# Patient Record
Sex: Female | Born: 1945 | Race: White | Hispanic: No | Marital: Married | Smoking: Former smoker
Health system: Southern US, Community
[De-identification: ages and names within clinical notes are randomized; demographics above are authoritative.]

## PROBLEM LIST (undated history)

## (undated) DIAGNOSIS — M1990 Unspecified osteoarthritis, unspecified site: Secondary | ICD-10-CM

## (undated) DIAGNOSIS — G4730 Sleep apnea, unspecified: Secondary | ICD-10-CM

## (undated) DIAGNOSIS — G459 Transient cerebral ischemic attack, unspecified: Secondary | ICD-10-CM

## (undated) DIAGNOSIS — I10 Essential (primary) hypertension: Secondary | ICD-10-CM

## (undated) DIAGNOSIS — C50919 Malignant neoplasm of unspecified site of unspecified female breast: Secondary | ICD-10-CM

## (undated) DIAGNOSIS — Z1379 Encounter for other screening for genetic and chromosomal anomalies: Secondary | ICD-10-CM

## (undated) HISTORY — DX: Transient cerebral ischemic attack, unspecified: G45.9

## (undated) HISTORY — PX: COLONOSCOPY: SHX174

## (undated) HISTORY — DX: Encounter for other screening for genetic and chromosomal anomalies: Z13.79

## (undated) HISTORY — DX: Unspecified osteoarthritis, unspecified site: M19.90

## (undated) HISTORY — DX: Essential (primary) hypertension: I10

## (undated) HISTORY — PX: DIAGNOSTIC LAPAROSCOPY: SUR761

## (undated) HISTORY — PX: BREAST BIOPSY: SHX20

---
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
## 1966-12-29 HISTORY — PX: APPENDECTOMY: SHX54

## 1984-12-29 DIAGNOSIS — C50919 Malignant neoplasm of unspecified site of unspecified female breast: Secondary | ICD-10-CM

## 1984-12-29 HISTORY — DX: Malignant neoplasm of unspecified site of unspecified female breast: C50.919

## 1984-12-29 HISTORY — PX: MASTECTOMY: SHX3

## 1984-12-29 HISTORY — PX: AUGMENTATION MAMMAPLASTY: SUR837

## 1984-12-29 HISTORY — PX: BREAST SURGERY: SHX581

## 1988-03-29 HISTORY — PX: BREAST SURGERY: SHX581

## 1991-12-30 HISTORY — PX: OTHER SURGICAL HISTORY: SHX169

## 2001-12-29 HISTORY — PX: BREAST EXCISIONAL BIOPSY: SUR124

## 2003-06-16 HISTORY — PX: BREAST SURGERY: SHX581

## 2004-12-04 ENCOUNTER — Ambulatory Visit: Payer: Self-pay | Admitting: General Surgery

## 2004-12-25 ENCOUNTER — Ambulatory Visit: Payer: Self-pay | Admitting: General Surgery

## 2005-02-07 HISTORY — PX: BREAST SURGERY: SHX581

## 2005-07-28 ENCOUNTER — Ambulatory Visit: Payer: Self-pay | Admitting: General Surgery

## 2006-03-16 ENCOUNTER — Ambulatory Visit: Payer: Self-pay | Admitting: General Surgery

## 2006-12-29 DIAGNOSIS — I10 Essential (primary) hypertension: Secondary | ICD-10-CM

## 2006-12-29 DIAGNOSIS — G459 Transient cerebral ischemic attack, unspecified: Secondary | ICD-10-CM

## 2006-12-29 HISTORY — DX: Transient cerebral ischemic attack, unspecified: G45.9

## 2006-12-29 HISTORY — DX: Essential (primary) hypertension: I10

## 2007-03-10 ENCOUNTER — Ambulatory Visit: Payer: Self-pay | Admitting: General Surgery

## 2007-10-07 ENCOUNTER — Observation Stay: Payer: Self-pay | Admitting: Internal Medicine

## 2007-10-07 ENCOUNTER — Other Ambulatory Visit: Payer: Self-pay

## 2007-10-22 ENCOUNTER — Ambulatory Visit: Payer: Self-pay | Admitting: Internal Medicine

## 2008-03-03 ENCOUNTER — Ambulatory Visit: Payer: Self-pay | Admitting: General Surgery

## 2008-12-29 HISTORY — PX: OTHER SURGICAL HISTORY: SHX169

## 2008-12-29 HISTORY — PX: DEBRIDEMENT AND CLOSURE WOUND: SHX5614

## 2009-03-05 ENCOUNTER — Ambulatory Visit: Payer: Self-pay | Admitting: General Surgery

## 2009-03-29 ENCOUNTER — Ambulatory Visit: Payer: Self-pay | Admitting: General Surgery

## 2009-04-20 ENCOUNTER — Ambulatory Visit: Payer: Self-pay | Admitting: Unknown Physician Specialty

## 2009-04-24 ENCOUNTER — Ambulatory Visit: Payer: Self-pay | Admitting: Unknown Physician Specialty

## 2010-03-07 ENCOUNTER — Ambulatory Visit: Payer: Self-pay | Admitting: General Surgery

## 2011-03-10 ENCOUNTER — Ambulatory Visit: Payer: Self-pay | Admitting: General Surgery

## 2011-03-11 ENCOUNTER — Ambulatory Visit: Payer: Self-pay | Admitting: General Surgery

## 2011-03-17 ENCOUNTER — Ambulatory Visit: Payer: Self-pay | Admitting: General Surgery

## 2011-03-19 LAB — PATHOLOGY REPORT

## 2011-05-17 HISTORY — PX: BREAST SURGERY: SHX581

## 2011-08-19 ENCOUNTER — Ambulatory Visit: Payer: Self-pay | Admitting: General Surgery

## 2012-03-15 ENCOUNTER — Ambulatory Visit: Payer: Self-pay | Admitting: General Surgery

## 2012-08-24 ENCOUNTER — Ambulatory Visit: Payer: Self-pay | Admitting: General Surgery

## 2013-02-04 ENCOUNTER — Encounter: Payer: Self-pay | Admitting: *Deleted

## 2013-02-04 DIAGNOSIS — I10 Essential (primary) hypertension: Secondary | ICD-10-CM | POA: Insufficient documentation

## 2013-03-16 ENCOUNTER — Ambulatory Visit: Payer: Self-pay | Admitting: General Surgery

## 2013-03-17 ENCOUNTER — Encounter: Payer: Self-pay | Admitting: General Surgery

## 2013-03-23 ENCOUNTER — Ambulatory Visit (INDEPENDENT_AMBULATORY_CARE_PROVIDER_SITE_OTHER): Payer: Medicare Other | Admitting: General Surgery

## 2013-03-23 ENCOUNTER — Encounter: Payer: Self-pay | Admitting: General Surgery

## 2013-03-23 VITALS — BP 144/68 | HR 70 | Resp 14 | Ht <= 58 in | Wt 107.0 lb

## 2013-03-23 DIAGNOSIS — C50919 Malignant neoplasm of unspecified site of unspecified female breast: Secondary | ICD-10-CM

## 2013-03-23 DIAGNOSIS — C50912 Malignant neoplasm of unspecified site of left female breast: Secondary | ICD-10-CM

## 2013-03-23 DIAGNOSIS — Z853 Personal history of malignant neoplasm of breast: Secondary | ICD-10-CM

## 2013-03-23 NOTE — Progress Notes (Signed)
Patient ID: Kendra [name], female   DOB: May 12, 1946, 68 y.o.   MRN: [id number]  Chief Complaint  Patient presents with  . Breast Cancer Long Term Follow Up    HPI Kendra [name] is a 67 y.o. female here today for her follow up mammogram .Patient report no new breast problems .Previously treated left breast cancer treated by modified radical mastectomy in February of 1986. 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 This was an estrogen-negative tumor. She underwent a stereotactic biopsy of the right breast in 2004, 2006 and then again in 2012 for microcalcifications. HPI  Past Medical History  Diagnosis Date  . Hypertension 2008  . Arthritis   . TIA (transient ischemic attack) 2008  . Cancer 1986    Left Mastectomy ,ductal carcinoma.     Past Surgical History  Procedure Laterality Date  . Colonoscopy  2005     Dr. Lemar Livings  . Debridement and closure wound  2010  . Uterine biopsy   2010  . Diagnostic laparoscopy    . Tonsillectomy  1962  . Appendectomy  1968  . Breast surgery  1986    left mastectomy  . Disc repair   1993  . Basal cell carcimona removal   2010    left lower leg     Family History  Problem Relation Age of Onset  . Ovarian cancer Mother     17    Social History History  Substance Use Topics  . Smoking status: Former Smoker -- 0.50 packs/day for 2 years    Types: Cigarettes  . Smokeless tobacco: Never Used  . Alcohol Use: No    Allergies  Allergen Reactions  . Seasonal Ic (Cholestatin)     Current Outpatient Prescriptions  Medication Sig Dispense Refill  . aspirin EC 81 MG tablet Take 81 mg by mouth daily.      Marland Kitchen atorvastatin (LIPITOR) 40 MG tablet Take 40 mg by mouth daily.      . clonazePAM (KLONOPIN) 0.5 MG tablet Take 0.5 mg by mouth.      . fexofenadine (ALLEGRA) 180 MG tablet Take 180 mg by mouth as needed.      . Fluticasone Propionate (FLONASE NA) Place into the nose.      . zolpidem (AMBIEN) 10 MG  tablet Take 10 mg by mouth.       No current facility-administered medications for this visit.    Review of Systems Review of Systems  Constitutional: Negative.   Respiratory: Negative.   Cardiovascular: Negative.     Blood pressure 144/68, pulse 70, resp. rate 14, height 4\' 9"  (1.448 m), weight 107 lb (48.535 kg).  Physical Exam Physical Exam  Constitutional: She appears well-developed and well-nourished.  Neck: Normal range of motion. Neck supple.  Cardiovascular: Normal rate and normal heart sounds.   Pulmonary/Chest: Effort normal. Right breast exhibits no inverted nipple, no mass, no nipple discharge, no skin change and no tenderness.    Data Reviewed Right breast mammogram dated 03/16/2013 showed stable calcifications. BI-RADS-2. Previously placed marking clips are unchanged.  Assessment    No evidence of right breast cancer or left breast cancer recurrence.    Plan    Followup exam in one year will be planned with a right breast mammogram.       Kendra [name] 03/26/2013, 9:01 AM

## 2013-03-26 ENCOUNTER — Encounter: Payer: Self-pay | Admitting: General Surgery

## 2013-03-26 DIAGNOSIS — C50912 Malignant neoplasm of unspecified site of left female breast: Secondary | ICD-10-CM | POA: Insufficient documentation

## 2014-03-17 ENCOUNTER — Ambulatory Visit: Payer: Self-pay | Admitting: General Surgery

## 2014-03-19 ENCOUNTER — Encounter: Payer: Self-pay | Admitting: General Surgery

## 2014-03-29 DIAGNOSIS — Z1379 Encounter for other screening for genetic and chromosomal anomalies: Secondary | ICD-10-CM

## 2014-03-29 HISTORY — DX: Encounter for other screening for genetic and chromosomal anomalies: Z13.79

## 2014-03-30 ENCOUNTER — Encounter: Payer: Self-pay | Admitting: General Surgery

## 2014-03-30 ENCOUNTER — Ambulatory Visit (INDEPENDENT_AMBULATORY_CARE_PROVIDER_SITE_OTHER): Payer: Medicare Other | Admitting: General Surgery

## 2014-03-30 VITALS — BP 110/68 | HR 72 | Resp 12 | Ht <= 58 in | Wt 114.0 lb

## 2014-03-30 DIAGNOSIS — C50912 Malignant neoplasm of unspecified site of left female breast: Secondary | ICD-10-CM

## 2014-03-30 DIAGNOSIS — Z853 Personal history of malignant neoplasm of breast: Secondary | ICD-10-CM

## 2014-03-30 DIAGNOSIS — Z1211 Encounter for screening for malignant neoplasm of colon: Secondary | ICD-10-CM

## 2014-03-30 MED ORDER — POLYETHYLENE GLYCOL 3350 17 GM/SCOOP PO POWD: ORAL | Status: DC

## 2014-03-30 NOTE — Progress Notes (Signed)
Patient ID: Lexany Belknap, female   DOB: 05-16-1946, 67 y.o.   MRN: [id number]  Chief Complaint  Patient presents with  . Follow-up    mammogram    HPI Nylene Inlow is a 68 y.o. female who presents for a breast evaluation. The most recent right breast mammogram was done on 03/17/14.Patient does perform regular self breast checks and gets regular mammograms done.    HPI  Past Medical History  Diagnosis Date  . Hypertension 2008  . Arthritis   . TIA (transient ischemic attack) 2008  . Cancer 1986    T1c,N0 invasive ductal carcinoma     Past Surgical History  Procedure Laterality Date  . Colonoscopy  2005     Dr. Bary Castilla  . Debridement and closure wound  2010  . Uterine biopsy   2010  . Diagnostic laparoscopy    . Tonsillectomy  1962  . Appendectomy  1968  . Disc repair   1993  . Basal cell carcimona removal   2010    left lower leg   . Breast surgery  1986    Left modified radical mastectomy.  . Breast surgery Right April 1989    biopsy: Fibrocystic changes.  . Breast surgery Right June 16, 2003    stereotactic biopsy showing stromal fibrosis and microcalcifications.  . Breast surgery Right May 17, 2011    hyalinized fibroadenoma  . Breast surgery Right February 07, 2005    Sclerosed fibroadenoma    Family History  Problem Relation Age of Onset  . Ovarian cancer Mother     49    Social History History  Substance Use Topics  . Smoking status: Former Smoker -- 0.50 packs/day for 2 years    Types: Cigarettes  . Smokeless tobacco: Never Used  . Alcohol Use: No    Allergies  Allergen Reactions  . Seasonal Ic [Cholestatin]     Current Outpatient Prescriptions  Medication Sig Dispense Refill  . aspirin EC 81 MG tablet Take 81 mg by mouth daily.      Marland Kitchen atorvastatin (LIPITOR) 40 MG tablet Take 40 mg by mouth daily.      . clonazePAM (KLONOPIN) 0.5 MG tablet Take 0.5 mg by mouth.      . fexofenadine (ALLEGRA) 180 MG tablet Take 180 mg by mouth as needed.       . Fluticasone Propionate (FLONASE NA) Place into the nose.      . potassium chloride (MICRO-K) 10 MEQ CR capsule Take 1 capsule by mouth daily.      . propranolol (INDERAL) 40 MG tablet Take 1 tablet by mouth daily.      Marland Kitchen zolpidem (AMBIEN) 10 MG tablet Take 10 mg by mouth.      . polyethylene glycol powder (GLYCOLAX/MIRALAX) powder 255 grams one bottle for colonoscopy prep  255 g  0   No current facility-administered medications for this visit.    Review of Systems Review of Systems  Constitutional: Negative.   Respiratory: Negative.   Cardiovascular: Negative.     Blood pressure 110/68, pulse 72, resp. rate 12, height 4\' 9"  (1.448 m), weight 114 lb (51.71 kg).  Physical Exam Physical Exam  Constitutional: She is oriented to person, place, and time. She appears well-developed and well-nourished.  Eyes: Conjunctivae are normal.  Neck: Neck supple.  Cardiovascular: Normal rate, regular rhythm and normal heart sounds.   Pulmonary/Chest: Effort normal and breath sounds normal. Right breast exhibits no inverted nipple, no mass, no nipple discharge, no skin change  and no tenderness.  Reconstied left breast    Neurological: She is alert and oriented to person, place, and time.  Skin: Skin is warm and dry.    Data Reviewed Right breast mammogram dated March 17, 2014 was reviewed and compared a test dose. No interval change. BI-RAD-1.  Assessment    Benign breast exam.  No evidence of left breast cancer recurrence.  Candidate for screening colonoscopy.     Plan    Followup mammograms were obtained in one year. The roll for screening colonoscopy was discussed. She is familiar with the procedure. Risks including bleeding and perforation were reviewed.      Patient has been scheduled for a colonoscopy on 04-12-14 at [facility]. It is okay for patient to continue 81 mg aspirin.  Robert Bellow 04/01/2014, 8:56 AM

## 2014-03-30 NOTE — Patient Instructions (Addendum)
Colonoscopy A colonoscopy is an exam to look at the entire large intestine (colon). This exam can help find problems such as tumors, polyps, inflammation, and areas of bleeding. The exam takes about 1 hour.  LET [facility] CARE PROVIDER KNOW ABOUT:   Any allergies you have.  All medicines you are taking, including vitamins, herbs, eye drops, creams, and over-the-counter medicines.  Previous problems you or members of your family have had with the use of anesthetics.  Any blood disorders you have.  Previous surgeries you have had.  Medical conditions you have. RISKS AND COMPLICATIONS  Generally, this is a safe procedure. However, as with any procedure, complications can occur. Possible complications include:  Bleeding.  Tearing or rupture of the colon wall.  Reaction to medicines given during the exam.  Infection (rare). BEFORE THE PROCEDURE   Ask your health care provider about changing or stopping your regular medicines.  You may be prescribed an oral bowel prep. This involves drinking a large amount of medicated liquid, starting the day before your procedure. The liquid will cause you to have multiple loose stools until your stool is almost clear or light green. This cleans out your colon in preparation for the procedure.  Do not eat or drink anything else once you have started the bowel prep, unless your health care provider tells you it is safe to do so.  Arrange for someone to drive you home after the procedure. PROCEDURE   You will be given medicine to help you relax (sedative).  You will lie on your side with your knees bent.  A long, flexible tube with a light and camera on the end (colonoscope) will be inserted through the rectum and into the colon. The camera sends video back to a computer screen as it moves through the colon. The colonoscope also releases carbon dioxide gas to inflate the colon. This helps your health care provider see the area better.  During  the exam, your health care provider may take a small tissue sample (biopsy) to be examined under a microscope if any abnormalities are found.  The exam is finished when the entire colon has been viewed. AFTER THE PROCEDURE   Do not drive for 24 hours after the exam.  You may have a small amount of blood in your stool.  You may pass moderate amounts of gas and have mild abdominal cramping or bloating. This is caused by the gas used to inflate your colon during the exam.  Ask when your test results will be ready and how you will get your results. Make sure you get your test results. Document Released: 12/12/2000 Document Revised: 10/05/2013 Document Reviewed: 08/22/2013 [facility] Patient Information 2014 [place]. Patient to return in one year right breast diagnotic mammogram.  Patient has been scheduled for a colonoscopy on 04-12-14 at [facility]. It is okay for patient to continue 81 mg aspirin.

## 2014-04-01 ENCOUNTER — Encounter: Payer: Self-pay | Admitting: General Surgery

## 2014-04-01 ENCOUNTER — Other Ambulatory Visit: Payer: Self-pay | Admitting: General Surgery

## 2014-04-01 DIAGNOSIS — Z1211 Encounter for screening for malignant neoplasm of colon: Secondary | ICD-10-CM

## 2014-04-12 ENCOUNTER — Ambulatory Visit: Payer: Self-pay | Admitting: General Surgery

## 2014-04-12 DIAGNOSIS — Z1211 Encounter for screening for malignant neoplasm of colon: Secondary | ICD-10-CM

## 2014-04-13 ENCOUNTER — Telehealth: Payer: Self-pay | Admitting: *Deleted

## 2014-04-13 ENCOUNTER — Encounter: Payer: Self-pay | Admitting: General Surgery

## 2014-04-13 DIAGNOSIS — K625 Hemorrhage of anus and rectum: Secondary | ICD-10-CM

## 2014-04-13 NOTE — Telephone Encounter (Signed)
Notified patient as instructed. Discussed follow-up appointments, patient agrees  

## 2014-04-13 NOTE — Telephone Encounter (Signed)
Message copied by Carson Myrtle on Thu Apr 13, 2014  2:32 PM ------      Message from: [place], [place] W      Created: Thu Apr 13, 2014  2:05 PM       Please arrange for patient to have a MyRisk sample drawn, also, she needs a CBC for rectal bleeding.        HX: Breast cancer at age 68.       Next week is fine.  ------

## 2014-04-13 NOTE — Telephone Encounter (Signed)
appt for 04-19-14

## 2014-04-18 ENCOUNTER — Encounter: Payer: Self-pay | Admitting: *Deleted

## 2014-04-19 ENCOUNTER — Ambulatory Visit: Payer: Medicare Other | Admitting: *Deleted

## 2014-04-19 DIAGNOSIS — K625 Hemorrhage of anus and rectum: Secondary | ICD-10-CM

## 2014-04-19 DIAGNOSIS — C50912 Malignant neoplasm of unspecified site of left female breast: Secondary | ICD-10-CM

## 2014-04-19 NOTE — Progress Notes (Signed)
Genetic testing completed as well as Labs.

## 2014-04-20 LAB — CBC WITH DIFFERENTIAL
Basophils Absolute: 0.1 10*3/uL (ref 0.0–0.2)
Basos: 1 %
EOS ABS: 0.3 10*3/uL (ref 0.0–0.4)
Eos: 4 %
HCT: 41.2 % (ref 34.0–46.6)
Hemoglobin: 13.6 g/dL (ref 11.1–15.9)
IMMATURE GRANULOCYTES: 0 %
Immature Grans (Abs): 0 10*3/uL (ref 0.0–0.1)
LYMPHS ABS: 1.7 10*3/uL (ref 0.7–3.1)
Lymphs: 22 %
MCH: 28.7 pg (ref 26.6–33.0)
MCHC: 33 g/dL (ref 31.5–35.7)
MCV: 87 fL (ref 79–97)
MONOCYTES: 8 %
Monocytes Absolute: 0.6 10*3/uL (ref 0.1–0.9)
Neutrophils Absolute: 5 10*3/uL (ref 1.4–7.0)
Neutrophils Relative %: 65 %
Platelets: 221 10*3/uL (ref 150–379)
RBC: 4.74 x10E6/uL (ref 3.77–5.28)
RDW: 13.6 % (ref 12.3–15.4)
WBC: 7.7 10*3/uL (ref 3.4–10.8)

## 2014-05-04 ENCOUNTER — Encounter: Payer: Self-pay | Admitting: General Surgery

## 2014-05-09 ENCOUNTER — Telehealth: Payer: Self-pay | Admitting: General Surgery

## 2014-05-09 NOTE — Telephone Encounter (Signed)
MyRisk genetic results negative for deleterious mutation. Patient notified.

## 2014-06-01 ENCOUNTER — Encounter: Payer: Self-pay | Admitting: General Surgery

## 2014-10-30 ENCOUNTER — Encounter: Payer: Self-pay | Admitting: *Deleted

## 2015-03-20 ENCOUNTER — Encounter: Payer: Self-pay | Admitting: General Surgery

## 2015-03-20 ENCOUNTER — Ambulatory Visit: Payer: Self-pay | Admitting: General Surgery

## 2015-03-26 ENCOUNTER — Encounter: Payer: Self-pay | Admitting: General Surgery

## 2015-03-26 ENCOUNTER — Ambulatory Visit (INDEPENDENT_AMBULATORY_CARE_PROVIDER_SITE_OTHER): Payer: Medicare Other | Admitting: General Surgery

## 2015-03-26 VITALS — BP 116/68 | HR 68 | Resp 12 | Ht <= 58 in | Wt 119.0 lb

## 2015-03-26 DIAGNOSIS — C50912 Malignant neoplasm of unspecified site of left female breast: Secondary | ICD-10-CM

## 2015-03-26 NOTE — Progress Notes (Signed)
Patient ID: Kendra [name], female   DOB: 1946/01/27, 68 y.o.   MRN: [id number]  Chief Complaint  Patient presents with  . Follow-up    mammogram    HPI Kendra [name] is a 69 y.o. female. who presents for a breast evaluation. The most recent right breast mammogram was done on 03/20/15  .  Patient does perform regular self breast checks and gets regular mammograms done.    HPI  Past Medical History  Diagnosis Date  . Hypertension 2008  . Arthritis   . TIA (transient ischemic attack) 2008  . Cancer 1986    T1c,N0 invasive ductal carcinoma   . Genetic testing April 2015    No BRCA mutation.    Past Surgical History  Procedure Laterality Date  . Colonoscopy  2005,04/12/2014    Dr. Bary Castilla  . Debridement and closure wound  2010  . Uterine biopsy   2010  . Diagnostic laparoscopy    . Tonsillectomy  1962  . Appendectomy  1968  . Disc repair   1993  . Basal cell carcimona removal   2010    left lower leg   . Breast surgery  1986    Left modified radical mastectomy.  . Breast surgery Right April 1989    biopsy: Fibrocystic changes.  . Breast surgery Right June 16, 2003    stereotactic biopsy showing stromal fibrosis and microcalcifications.  . Breast surgery Right May 17, 2011    hyalinized fibroadenoma  . Breast surgery Right February 07, 2005    Sclerosed fibroadenoma    Family History  Problem Relation Age of Onset  . Ovarian cancer Mother 33    2010    Social History History  Substance Use Topics  . Smoking status: Former Smoker -- 0.50 packs/day for 2 years    Types: Cigarettes  . Smokeless tobacco: Never Used  . Alcohol Use: No    Allergies  Allergen Reactions  . Seasonal Ic [Cholestatin]     Current Outpatient Prescriptions  Medication Sig Dispense Refill  . aspirin EC 81 MG tablet Take 81 mg by mouth daily.    Marland Kitchen atorvastatin (LIPITOR) 40 MG tablet Take 40 mg by mouth daily.    . clonazePAM (KLONOPIN) 0.5 MG tablet Take 0.5 mg by mouth.    .  fexofenadine (ALLEGRA) 180 MG tablet Take 180 mg by mouth as needed.    . Fluticasone Propionate (FLONASE NA) Place into the nose.    . polyethylene glycol powder (GLYCOLAX/MIRALAX) powder 255 grams one bottle for colonoscopy prep 255 g 0  . potassium chloride (MICRO-K) 10 MEQ CR capsule Take 1 capsule by mouth daily.    . propranolol (INDERAL) 40 MG tablet Take 1 tablet by mouth daily.    Marland Kitchen zolpidem (AMBIEN) 10 MG tablet Take 10 mg by mouth.     No current facility-administered medications for this visit.    Review of Systems Review of Systems  Constitutional: Negative.   Respiratory: Negative.   Cardiovascular: Negative.     Blood pressure 116/68, pulse 68, resp. rate 12, height 4' 9"  (1.448 m), weight 119 lb (53.978 kg).  Physical Exam Physical Exam  Constitutional: She is oriented to person, place, and time. She appears well-developed and well-nourished.  Eyes: Conjunctivae are normal. No scleral icterus.  Neck: Neck supple.  Cardiovascular: Normal rate, regular rhythm and normal heart sounds.   Pulmonary/Chest: Effort normal and breath sounds normal. Right breast exhibits no inverted nipple, no mass, no nipple discharge, no  skin change and no tenderness.  Abdominal: Soft. Bowel sounds are normal. There is no tenderness.  Lymphadenopathy:    She has no cervical adenopathy.  Neurological: She is alert and oriented to person, place, and time.  Skin: Skin is warm and dry.  Reconstied left breast   Data Reviewed Screening mammogram of the right breastdated 03/20/2015 showed no interval change. BI-RADS  1.  Assessment    Benign breast exam.     Plan    The patient has been asked to return to the office in one year with a right screening mammogram.         PCP:  Sol Passer 03/26/2015, 9:48 PM

## 2015-03-26 NOTE — Patient Instructions (Signed)
The patient has been asked to return to the office in one year with a right screening mammogram.

## 2016-01-04 ENCOUNTER — Other Ambulatory Visit: Payer: Self-pay

## 2016-01-04 DIAGNOSIS — Z1231 Encounter for screening mammogram for malignant neoplasm of breast: Secondary | ICD-10-CM

## 2016-01-22 ENCOUNTER — Encounter: Payer: Self-pay | Admitting: Podiatry

## 2016-01-22 ENCOUNTER — Ambulatory Visit (INDEPENDENT_AMBULATORY_CARE_PROVIDER_SITE_OTHER): Payer: Medicare Other

## 2016-01-22 ENCOUNTER — Ambulatory Visit (INDEPENDENT_AMBULATORY_CARE_PROVIDER_SITE_OTHER): Payer: Medicare Other | Admitting: Podiatry

## 2016-01-22 DIAGNOSIS — M722 Plantar fascial fibromatosis: Secondary | ICD-10-CM

## 2016-01-22 DIAGNOSIS — R52 Pain, unspecified: Secondary | ICD-10-CM | POA: Diagnosis not present

## 2016-01-22 MED ORDER — DICLOFENAC SODIUM 75 MG PO TBEC: 75.0000 mg | DELAYED_RELEASE_TABLET | Freq: Two times a day (BID) | ORAL | Status: DC

## 2016-01-22 NOTE — Patient Instructions (Signed)

## 2016-01-22 NOTE — Progress Notes (Signed)
   Subjective:    Patient ID: Kendra [name], female    DOB: 12-29-1946, 70 y.o.   MRN: [id number]  HPI  70 year old female presents to the office today for concerns of left heel pain which has been ongoing for more than 6 months. 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 She also developed pain on the outside of the foot. No tingling or numbness. No swelling or redness. No injury or trauma. No pain at night. No claudication symptoms.   Has tried Dr. Zoe Lan inserts but not helping. She did buy "plantar fasciitis" inserts last week which helped some. She is taking ibuprofen which helps.     Review of Systems  All other systems reviewed and are negative.      Objective:   Physical Exam General: AAO x3, NAD  Dermatological: Skin is warm, dry and supple bilateral. [name] x 10 are well manicured; remaining integument appears unremarkable at this time. There are no open sores, no preulcerative lesions, no rash or signs of infection present.  Vascular: Dorsalis Pedis artery and Posterior Tibial artery pedal pulses are 2/4 bilateral with immedate capillary fill time. Pedal hair growth present. No varicosities and no lower extremity edema present bilateral. There is no pain with calf compression, swelling, warmth, erythema.   Neruologic: Grossly intact via light touch bilateral. Vibratory intact via tuning fork bilateral. Protective threshold with Semmes Wienstein monofilament intact to all pedal sites bilateral. Patellar and Achilles deep tendon reflexes 2+ bilateral. No Babinski or clonus noted bilateral.   Musculoskeletal: Tenderness to palpation along the plantar medial tubercle of the calcaneus at the insertion of plantar fascia on the left foot. There is no pain along the course of the plantar fascia within the arch of the foot. Plantar fascia appears to be intact. There is no pain with lateral compression of the calcaneus or pain with  vibratory sensation. There is no pain along the course or insertion of the achilles tendon. No other areas of tenderness to bilateral lower extremities. Muscular strength 5/5 in all groups tested bilateral.  Gait: Unassisted, Nonantalgic.       Assessment & Plan:  70 year old female likely plantar fasciitis left foot -Treatment options discussed including all alternatives, risks, and complications -Etiology of symptoms were discussed -X-rays were obtained and reviewed with the patient.  -Patient elects to proceed with steroid injection into the left heel. Under sterile skin preparation, a total of 2.5cc of kenalog 10, 0.5% Marcaine plain, and 2% lidocaine plain were infiltrated into the symptomatic area without complication. A band-aid was applied. Patient tolerated the injection well without complication. Post-injection care with discussed with the patient. Discussed with the patient to ice the area over the next couple of days to help prevent a steroid flare.  -Prescribed voltaren. Discussed side effects of the medication and directed to stop if any are to occur and call the office.  - Icing and stretching exercises daily. -Plantar fascial brace. -Shoegear changes -Follow-up in 3 weeks or sooner if any problems arise. In the meantime, encouraged to call the office with any questions, concerns, change in symptoms.   Celesta Gentile, DPM

## 2016-02-12 ENCOUNTER — Encounter: Payer: Self-pay | Admitting: Podiatry

## 2016-02-12 ENCOUNTER — Ambulatory Visit (INDEPENDENT_AMBULATORY_CARE_PROVIDER_SITE_OTHER): Payer: Medicare Other | Admitting: Podiatry

## 2016-02-12 DIAGNOSIS — M722 Plantar fascial fibromatosis: Secondary | ICD-10-CM | POA: Diagnosis not present

## 2016-02-12 NOTE — Progress Notes (Signed)
Patient ID: Kendra [name], female   DOB: 1946-03-15, 70 y.o.   MRN: [id number]   Subjective: Kendra [name] presents to the office today for follow-up evaluation of left heel pain. They state that they are doing much better but still having some pain to the bottom of the heel. They have been icing, stretching, try to wear supportive shoe as much as possible.  No other complaints at this time. No acute changes since last appointment. They deny any systemic complaints such as fevers, chills, nausea, vomiting.  Objective: General: AAO x3, NAD  Dermatological: Skin is warm, dry and supple bilateral. Nails x 10 are well manicured; remaining integument appears unremarkable at this time. There are no open sores, no preulcerative lesions, no rash or signs of infection present.  Vascular: Dorsalis Pedis artery and Posterior Tibial artery pedal pulses are 2/4 bilateral with immedate capillary fill time. Pedal hair growth present. There is no pain with calf compression, swelling, warmth, erythema.   Neruologic: Grossly intact via light touch bilateral. Vibratory intact via tuning fork bilateral. Protective threshold with Semmes Wienstein monofilament intact to all pedal sites bilateral.   Musculoskeletal: There is mild, but improved, tenderness palpation along the plantar medial tubercle of the calcaneus at the insertion of the plantar fascia on the left foot. There is no pain along the course of the plantar fascia within the arch of the foot. Plantar fascia appears to be intact bilaterally. There is no pain with lateral compression of the calcaneus and there is no pain with vibratory sensation. There is no pain along the course or insertion of the Achilles tendon. There are no other areas of tenderness to bilateral lower extremities. No gross boney pedal deformities bilateral. No pain, crepitus, or limitation noted with foot and ankle range of motion bilateral. Muscular strength 5/5 in all groups tested  bilateral.  Gait: Unassisted, Nonantalgic.   Assessment: Presents for follow-up evaluation for heel pain, likely plantar fasciitis   Plan: -Treatment options discussed including all alternatives, risks, and complications Patient elects to proceed with steroid injection into the left heel. Under sterile skin preparation, a total of 2.5cc of kenalog 10, 0.5% Marcaine plain, and 2% lidocaine plain were infiltrated into the symptomatic area without complication. A band-aid was applied. Patient tolerated the injection well without complication. Post-injection care with discussed with the patient. Discussed with the patient to ice the area over the next couple of days to help prevent a steroid flare.  -Ice and stretching exercises on a daily basis. -Plantar fascia brace -Continue supportive shoe gear. Discussed orthotics but will see how she does with new shoes over the next couple of weeks.  -Follow-up in 4 weeks or sooner if any problems arise. In the meantime, encouraged to call the office with any questions, concerns, change in symptoms.   Celesta Gentile, DPM

## 2016-03-11 ENCOUNTER — Ambulatory Visit: Payer: Medicare Other | Admitting: Podiatry

## 2016-03-13 ENCOUNTER — Encounter: Payer: Self-pay | Admitting: Podiatry

## 2016-03-13 ENCOUNTER — Ambulatory Visit (INDEPENDENT_AMBULATORY_CARE_PROVIDER_SITE_OTHER): Payer: Medicare Other | Admitting: Podiatry

## 2016-03-13 VITALS — BP 134/68 | HR 72 | Resp 18

## 2016-03-13 DIAGNOSIS — M722 Plantar fascial fibromatosis: Secondary | ICD-10-CM

## 2016-03-14 DIAGNOSIS — M722 Plantar fascial fibromatosis: Secondary | ICD-10-CM | POA: Insufficient documentation

## 2016-03-14 NOTE — Progress Notes (Signed)
Patient ID: Kendra [name], female   DOB: October 06, 1946, 70 y.o.   MRN: [id number]  Subjective: Kendra [name] presents to the office today for follow-up evaluation of left heel pain. She feels overall she is improving but she still gets pain in the bottom of her left heel walking and standing at work. She states when she is off of work she does get relief. She also states that the injections have been helping. No numbness or tingling. They have been icing, stretching, try to wear supportive shoe as much as possible.  No other complaints at this time. No acute changes since last appointment. They deny any systemic complaints such as fevers, chills, nausea, vomiting.  Objective: General: AAO x3, NAD  Dermatological: Skin is warm, dry and supple bilateral. Nails x 10 are well manicured; remaining integument appears unremarkable at this time. There are no open sores, no preulcerative lesions, no rash or signs of infection present.  Vascular: Dorsalis Pedis artery and Posterior Tibial artery pedal pulses are 2/4 bilateral with immedate capillary fill time. Pedal hair growth present. There is no pain with calf compression, swelling, warmth, erythema.   Neruologic: Grossly intact via light touch bilateral. Vibratory intact via tuning fork bilateral. Protective threshold with Semmes Wienstein monofilament intact to all pedal sites bilateral.   Musculoskeletal: There is improved, tenderness palpation along the plantar medial tubercle of the calcaneus at the insertion of the plantar fascia on the left foot. There is no pain along the course of the plantar fascia within the arch of the foot. Plantar fascia appears to be intact bilaterally. There is no pain with lateral compression of the calcaneus and there is no pain with vibratory sensation. There is no pain along the course or insertion of the Achilles tendon. There are no other areas of tenderness to bilateral lower extremities. No gross boney pedal  deformities bilateral. No pain, crepitus, or limitation noted with foot and ankle range of motion bilateral. Muscular strength 5/5 in all groups tested bilateral.  Gait: Unassisted, Nonantalgic.   Assessment: Presents for follow-up evaluation for heel pain, likely plantar fasciitis and biomechanical changes.   Plan: -Treatment options discussed including all alternatives, risks, and complications Patient elects to proceed with steroid injection into the left heel. Under sterile skin preparation, a total of 2.5cc of kenalog 10, 0.5% Marcaine plain, and 2% lidocaine plain were infiltrated into the symptomatic area without complication. A band-aid was applied. Patient tolerated the injection well without complication. Post-injection care with discussed with the patient. Discussed with the patient to ice the area over the next couple of days to help prevent a steroid flare.  -Discussed orthotics. She wishes to hold off on custom due to cost. She would like to pursue with power steps which are dispensed today. -Plantar fascia brace as needed. -Voltaren prn -Continue supportive shoe gear. Discussed orthotics but will see how she does with new shoes over the next couple of weeks.  -Follow-up in 4 weeks or sooner if any problems arise. In the meantime, encouraged to call the office with any questions, concerns, change in symptoms.   Celesta Gentile, DPM

## 2016-03-20 ENCOUNTER — Ambulatory Visit
Admission: RE | Admit: 2016-03-20 | Discharge: 2016-03-20 | Disposition: A | Discharge status: Home / Self Care | Payer: Medicare Other | Source: Ambulatory Visit | Attending: General Surgery | Admitting: General Surgery

## 2016-03-20 ENCOUNTER — Other Ambulatory Visit: Payer: Self-pay | Admitting: General Surgery

## 2016-03-20 DIAGNOSIS — Z1231 Encounter for screening mammogram for malignant neoplasm of breast: Secondary | ICD-10-CM

## 2016-03-26 ENCOUNTER — Encounter: Payer: Self-pay | Admitting: General Surgery

## 2016-03-26 ENCOUNTER — Ambulatory Visit (INDEPENDENT_AMBULATORY_CARE_PROVIDER_SITE_OTHER): Payer: Medicare Other | Admitting: General Surgery

## 2016-03-26 VITALS — BP 118/74 | HR 68 | Resp 12 | Ht <= 58 in | Wt 118.0 lb

## 2016-03-26 DIAGNOSIS — Z853 Personal history of malignant neoplasm of breast: Secondary | ICD-10-CM | POA: Diagnosis not present

## 2016-03-26 NOTE — Progress Notes (Signed)
Patient ID: Kendra [name], female   DOB: 12-Dec-1946, 69 y.o.   MRN: [id number]  Chief Complaint  Patient presents with  . Follow-up    mammogram    HPI Kendra [name] is a 70 y.o. female.  who presents for her follow up breast cancer and a breast evaluation. The most recent right breast mammogram was done on 03/20/16 .  Patient does perform regular self breast checks and gets regular mammograms done. No new breast issues.  HPI  Past Medical History  Diagnosis Date  . Hypertension 2008  . Arthritis   . TIA (transient ischemic attack) 2008  . Genetic testing April 2015    No BRCA mutation.  . Cancer [facility]) 1986    T1c,N0 invasive ductal carcinoma LEFT    Past Surgical History  Procedure Laterality Date  . Colonoscopy  2005,04/12/2014    Dr. Bary Castilla  . Debridement and closure wound  2010  . Uterine biopsy   2010  . Diagnostic laparoscopy    . Tonsillectomy  1962  . Appendectomy  1968  . Disc repair   1993  . Basal cell carcimona removal   2010    left lower leg   . Breast surgery  1986    Left modified radical mastectomy.  . Breast surgery Right April 1989    biopsy: Fibrocystic changes.  . Breast surgery Right June 16, 2003    stereotactic biopsy showing stromal fibrosis and microcalcifications.  . Breast surgery Right May 17, 2011    hyalinized fibroadenoma  . Breast surgery Right February 07, 2005    Sclerosed fibroadenoma  . Breast biopsy Right 2003 2006 2012    Benign  . Mastectomy Left 1986    Left Implant    Family History  Problem Relation Age of Onset  . Ovarian cancer Mother 80    2010  . Breast cancer Cousin     Social History Social History  Substance Use Topics  . Smoking status: Former Smoker -- 0.50 packs/day for 2 years    Types: Cigarettes  . Smokeless tobacco: Never Used  . Alcohol Use: No    Allergies  Allergen Reactions  . Seasonal Ic [Cholestatin]     Current Outpatient Prescriptions  Medication Sig Dispense  Refill  . aspirin EC 81 MG tablet Take 81 mg by mouth daily.    Marland Kitchen atorvastatin (LIPITOR) 40 MG tablet Take 40 mg by mouth daily.    . clonazePAM (KLONOPIN) 0.5 MG tablet Take 0.5 mg by mouth.    . fexofenadine (ALLEGRA) 180 MG tablet Take 180 mg by mouth as needed.    . Fluticasone Propionate (FLONASE NA) Place into the nose.    . potassium chloride (MICRO-K) 10 MEQ CR capsule Take 1 capsule by mouth daily.    . propranolol (INDERAL) 40 MG tablet Take 1 tablet by mouth daily.    Marland Kitchen zolpidem (AMBIEN) 10 MG tablet Take 10 mg by mouth.     No current facility-administered medications for this visit.    Review of Systems Review of Systems  Constitutional: Negative.   Respiratory: Negative.   Cardiovascular: Negative.     Blood pressure 118/74, pulse 68, resp. rate 12, height _0  (1.448 m), weight 118 lb (53.524 kg).  Physical Exam Physical Exam  Constitutional: She is oriented to person, place, and time. She appears well-developed and well-nourished.  HENT:  Mouth/Throat: Oropharynx is clear and moist.  Eyes: Conjunctivae are normal. No scleral icterus.  Neck: Neck supple.  Cardiovascular: Normal rate, regular rhythm and normal heart sounds.   Pulmonary/Chest: Effort normal and breath sounds normal. Right breast exhibits no inverted nipple, no mass, no nipple discharge, no skin change and no tenderness. Left breast exhibits no inverted nipple, no mass, no nipple discharge, no skin change and no tenderness.    Reconstructive left breast stable.  Lymphadenopathy:    She has no cervical adenopathy.    She has no axillary adenopathy.  Neurological: She is alert and oriented to person, place, and time.  Skin: Skin is warm and dry.  Psychiatric: Her behavior is normal.    Data Reviewed Right breast mammogram dated 03/20/2016 was reviewed. No interval change. BI-RADS-2.  Assessment    Benign breast exam.    Plan        The patient has been asked to return to the office in  one year with a right screening mammogram.     PCP:  Kirk Ruths This information has been scribed by Karie Fetch RN, BSN,BC.    Robert Bellow 03/26/2016, 6:24 PM

## 2016-03-26 NOTE — Patient Instructions (Addendum)
The patient is aware to call back for any questions or concerns. The patient has been asked to return to the office in one year with a right screening mammogram.

## 2016-04-10 ENCOUNTER — Ambulatory Visit (INDEPENDENT_AMBULATORY_CARE_PROVIDER_SITE_OTHER): Payer: Medicare Other | Admitting: Podiatry

## 2016-04-10 ENCOUNTER — Encounter: Payer: Self-pay | Admitting: Podiatry

## 2016-04-10 VITALS — BP 155/94 | HR 68 | Resp 18

## 2016-04-10 DIAGNOSIS — M722 Plantar fascial fibromatosis: Secondary | ICD-10-CM

## 2016-04-13 NOTE — Progress Notes (Signed)
Patient ID: Kendra [name], female   DOB: 1946/09/26, 70 y.o.   MRN: [id number]  Subjective: [name] presents to the office today for follow-up evaluation of left heel pain. She feels that her heel pain is significantly improved compared to what it was previously. She really only gets pain is when she does a lot of standing and walking while at work. She has possible over-the-counter inserts, power steps, which seems to help but given her high arches it does not contour exact.   No other complaints at this time. No acute changes since last appointment. They deny any systemic complaints such as fevers, chills, nausea, vomiting.  Objective: General: AAO x3, NAD  Dermatological: Skin is warm, dry and supple bilateral. Nails x 10 are well manicured; remaining integument appears unremarkable at this time. There are no open sores, no preulcerative lesions, no rash or signs of infection present.  Vascular: Dorsalis Pedis artery and Posterior Tibial artery pedal pulses are 2/4 bilateral with immedate capillary fill time. Pedal hair growth present. There is no pain with calf compression, swelling, warmth, erythema.   Neruologic: Grossly intact via light touch bilateral. Vibratory intact via tuning fork bilateral. Protective threshold with Semmes Wienstein monofilament intact to all pedal sites bilateral.   Musculoskeletal: There is greatly improved  tenderness palpation along the plantar medial tubercle of the calcaneus at the insertion of the plantar fascia on the left foot. There is no pain along the course of the plantar fascia within the arch of the foot. Plantar fascia appears to be intact bilaterally. There is no pain with lateral compression of the calcaneus and there is no pain with vibratory sensation. There is no pain along the course or insertion of the Achilles tendon. There are no other areas of tenderness to bilateral lower extremities. No gross boney pedal deformities bilateral. No  pain, crepitus, or limitation noted with foot and ankle range of motion bilateral. Muscular strength 5/5 in all groups tested bilateral.  Gait: Unassisted, Nonantalgic.   Assessment: Presents for follow-up evaluation for heel pain, likely plantar fasciitis and biomechanical changes.   Plan: -Treatment options discussed including all alternatives, risks, and complications -At this point she is I have 3 steroid injections we will hold off on this as she is ready had this number and her pain has greatly improved. -I discussed with her custom orthotics that due to cost she wishes to hold off. I did modify her over-the-counter inserts help increase the arch. -Plantar fascial taping was applied today. She states this helped significantly. -Continue stretching and icing. -Voltaren prn -Continue supportive shoe gear. Discussed orthotics but will see how she does with new shoes over the next couple of weeks.  -Follow-up in 4 weeks if symptoms continue or sooner if any problems arise. In the meantime, encouraged to call the office with any questions, concerns, change in symptoms.   Celesta Gentile, DPM

## 2017-01-23 ENCOUNTER — Other Ambulatory Visit: Payer: Self-pay

## 2017-01-23 DIAGNOSIS — Z1231 Encounter for screening mammogram for malignant neoplasm of breast: Secondary | ICD-10-CM

## 2017-03-23 ENCOUNTER — Ambulatory Visit
Admission: RE | Admit: 2017-03-23 | Discharge: 2017-03-23 | Disposition: A | Discharge status: Home / Self Care | Payer: Medicare Other | Source: Ambulatory Visit | Attending: General Surgery | Admitting: General Surgery

## 2017-03-23 DIAGNOSIS — Z1231 Encounter for screening mammogram for malignant neoplasm of breast: Secondary | ICD-10-CM | POA: Insufficient documentation

## 2017-03-23 HISTORY — DX: Malignant neoplasm of unspecified site of unspecified female breast: C50.919

## 2017-03-26 ENCOUNTER — Encounter (INDEPENDENT_AMBULATORY_CARE_PROVIDER_SITE_OTHER): Payer: Self-pay

## 2017-03-31 ENCOUNTER — Encounter: Payer: Self-pay | Admitting: General Surgery

## 2017-03-31 ENCOUNTER — Ambulatory Visit (INDEPENDENT_AMBULATORY_CARE_PROVIDER_SITE_OTHER): Payer: Medicare Other | Admitting: General Surgery

## 2017-03-31 VITALS — BP 144/76 | HR 70 | Resp 12 | Ht <= 58 in | Wt 115.0 lb

## 2017-03-31 DIAGNOSIS — Z853 Personal history of malignant neoplasm of breast: Secondary | ICD-10-CM

## 2017-03-31 NOTE — Progress Notes (Signed)
Patient ID: Kendra [name], female   DOB: [birth year], 71 y.o.   MRN: [id number]  Chief Complaint  Patient presents with  . Follow-up    mammogram    HPI Kendra [name] is a 71 y.o. female.  who presents for her follow up breast cancer and a breast evaluation. The most recent mammogram was done on 03-23-17.  Patient does perform regular self breast checks and gets regular mammograms done. No new breast issues.   She is having some left shoulder discomfort, worse with activity, numbness to elbow.   HPI  Past Medical History:  Diagnosis Date  . Arthritis   . Breast cancer ([place]) 1986   T1c,N0 invasive ductal carcinoma LEFT  . Genetic testing April 2015   No BRCA mutation.  . Hypertension 2008  . TIA (transient ischemic attack) 2008    Past Surgical History:  Procedure Laterality Date  . APPENDECTOMY  1968  . AUGMENTATION MAMMAPLASTY Left 1986   LEFT ONLY - MASTECTOMY RECONSTRUCTION  . basal cell carcimona removal   2010   left lower leg   . BREAST BIOPSY Right 2006 & 2012   Benign  . BREAST EXCISIONAL BIOPSY Right 2003   NEG  . BREAST SURGERY  1986   Left modified radical mastectomy.  Marland Kitchen BREAST SURGERY Right April 1989   biopsy: Fibrocystic changes.  Marland Kitchen BREAST SURGERY Right June 16, 2003   stereotactic biopsy showing stromal fibrosis and microcalcifications.  Marland Kitchen BREAST SURGERY Right May 17, 2011   hyalinized fibroadenoma  . BREAST SURGERY Right February 07, 2005   Sclerosed fibroadenoma  . COLONOSCOPY  2005,04/12/2014   Normal exam in 2015, 10 year follow-up recommended. Dr. Bary Castilla  . DEBRIDEMENT AND CLOSURE WOUND  2010  . DIAGNOSTIC LAPAROSCOPY    . Disc repair   1993  . MASTECTOMY Left 1986   BREAST CANCER  . TONSILLECTOMY  1962  . uterine biopsy   2010    Family History  Problem Relation Age of Onset  . Breast cancer Cousin   . Ovarian cancer Mother 64    2010    Social History Social History  Substance Use Topics  . Smoking status: Former Smoker     Packs/day: 0.50    Years: 2.00    Types: Cigarettes  . Smokeless tobacco: Never Used  . Alcohol use No    Allergies  Allergen Reactions  . Seasonal Ic [Cholestatin]     Current Outpatient Prescriptions  Medication Sig Dispense Refill  . Fexofenadine HCl (ALLERGY 24-HR PO) Take by mouth as needed.    Marland Kitchen aspirin EC 81 MG tablet Take 81 mg by mouth daily.    Marland Kitchen atorvastatin (LIPITOR) 40 MG tablet Take 40 mg by mouth daily.    . clonazePAM (KLONOPIN) 0.5 MG tablet Take 0.5 mg by mouth.    . Fluticasone Propionate (FLONASE NA) Place into the nose.    . potassium chloride (MICRO-K) 10 MEQ CR capsule Take 1 capsule by mouth daily.    . propranolol (INDERAL) 40 MG tablet Take 1 tablet by mouth daily.    Marland Kitchen zolpidem (AMBIEN) 10 MG tablet Take 10 mg by mouth.     No current facility-administered medications for this visit.     Review of Systems Review of Systems  Constitutional: Negative.   Respiratory: Negative.   Cardiovascular: Negative.     Blood pressure (!) 144/76, pulse 70, resp. rate 12, height 4' 9"  (1.448 m), weight 115 lb (52.2 kg).  Physical Exam Physical  Exam  Constitutional: She is oriented to person, place, and time. She appears well-developed and well-nourished.  HENT:  Mouth/Throat: Oropharynx is clear and moist.  Eyes: Conjunctivae are normal. No scleral icterus.  Neck: Neck supple.  Cardiovascular: Normal rate, regular rhythm and normal heart sounds.   Pulmonary/Chest: Effort normal and breath sounds normal. Right breast exhibits no inverted nipple, no mass, no nipple discharge, no skin change and no tenderness. Left breast exhibits no inverted nipple, no mass, no nipple discharge, no skin change and no tenderness.    Reconstructive left breast stable.  Lymphadenopathy:    She has no cervical adenopathy.    She has no axillary adenopathy.  Neurological: She is alert and oriented to person, place, and time. She has normal strength. She displays no atrophy.  She exhibits normal muscle tone.  Tender over left supraspinatus   Skin: Skin is warm and dry.  Psychiatric: Her behavior is normal.    Data Reviewed Screening mammogram dated 03/23/2017 of the right breast was reviewed. BI-RADS-1.  Assessment    No evidence of recurrent breast cancer.  Right neck/shoulder discomfort with occasional numbness involving the left upper arms circumferentially. Negative motor exam.    Plan         Follow up with Dr Ouida Sills regarding her shoulder pain.  Patient will be asked to return to the office in one year with a right screening mammogram.   This information has been scribed by Karie Fetch RN, BSN,BC.   Robert Bellow 03/31/2017, 8:31 PM

## 2017-03-31 NOTE — Patient Instructions (Addendum)
The patient is aware to call back for any questions or concerns. Patient will be asked to return to the office in one year with a Uni R screening mammogram

## 2018-01-27 ENCOUNTER — Other Ambulatory Visit: Payer: Self-pay

## 2018-01-27 DIAGNOSIS — Z1231 Encounter for screening mammogram for malignant neoplasm of breast: Secondary | ICD-10-CM

## 2018-02-01 ENCOUNTER — Encounter: Payer: Self-pay | Admitting: *Deleted

## 2018-03-24 ENCOUNTER — Ambulatory Visit
Admission: RE | Admit: 2018-03-24 | Discharge: 2018-03-24 | Disposition: A | Discharge status: Home / Self Care | Payer: Medicare Other | Source: Ambulatory Visit | Attending: General Surgery | Admitting: General Surgery

## 2018-03-24 DIAGNOSIS — Z1231 Encounter for screening mammogram for malignant neoplasm of breast: Secondary | ICD-10-CM | POA: Diagnosis present

## 2018-04-08 ENCOUNTER — Ambulatory Visit (INDEPENDENT_AMBULATORY_CARE_PROVIDER_SITE_OTHER): Payer: Medicare Other | Admitting: General Surgery

## 2018-04-08 ENCOUNTER — Encounter: Payer: Self-pay | Admitting: General Surgery

## 2018-04-08 VITALS — BP 118/68 | HR 68 | Resp 14 | Ht 59.0 in | Wt 119.0 lb

## 2018-04-08 DIAGNOSIS — Z853 Personal history of malignant neoplasm of breast: Secondary | ICD-10-CM | POA: Diagnosis not present

## 2018-04-08 NOTE — Progress Notes (Signed)
Patient ID: Kendra [name], female   DOB: 29-Aug-1946, 72 y.o.   MRN: [id number]  Chief Complaint  Patient presents with  . Follow-up    HPI Kendra [name] is a 72 y.o. female who presents for a breast evaluation. The most recent mammogram was done on 03/24/2018 .  Patient does perform regular self breast checks and gets regular mammograms done.  Patient states sometimes she get a rash on her back and ankles.  She has been evaluated by Kendra Ser, MD from dermatology in this regard.  HPI  Past Medical History:  Diagnosis Date  . Arthritis   . Breast cancer ([place]) 1986   T1c,N0 invasive ductal carcinoma LEFT  . Genetic testing April 2015   No BRCA mutation.  . Hypertension 2008  . TIA (transient ischemic attack) 2008    Past Surgical History:  Procedure Laterality Date  . APPENDECTOMY  1968  . AUGMENTATION MAMMAPLASTY Left 1986   LEFT ONLY - MASTECTOMY RECONSTRUCTION  . basal cell carcimona removal   2010   left lower leg   . BREAST BIOPSY Right 2006 & 2012   Benign  . BREAST EXCISIONAL BIOPSY Right 2003   NEG  . BREAST SURGERY  1986   Left modified radical mastectomy.  Marland Kitchen BREAST SURGERY Right April 1989   biopsy: Fibrocystic changes.  Marland Kitchen BREAST SURGERY Right June 16, 2003   stereotactic biopsy showing stromal fibrosis and microcalcifications.  Marland Kitchen BREAST SURGERY Right May 17, 2011   hyalinized fibroadenoma  . BREAST SURGERY Right February 07, 2005   Sclerosed fibroadenoma  . COLONOSCOPY  2005,04/12/2014   Normal exam in 2015, 10 year follow-up recommended. Dr. Bary Castilla  . DEBRIDEMENT AND CLOSURE WOUND  2010  . DIAGNOSTIC LAPAROSCOPY    . Disc repair   1993  . MASTECTOMY Left 1986   BREAST CANCER  . TONSILLECTOMY  1962  . uterine biopsy   2010    Family History  Problem Relation Age of Onset  . Breast cancer Cousin   . Ovarian cancer Mother 11       2010    Social History Social History   Tobacco Use  . Smoking status: Former Smoker    Packs/day:  0.50    Years: 2.00    Pack years: 1.00    Types: Cigarettes  . Smokeless tobacco: Never Used  Substance Use Topics  . Alcohol use: No  . Drug use: No    Allergies  Allergen Reactions  . Seasonal Ic [Cholestatin]     Current Outpatient Medications  Medication Sig Dispense Refill  . amLODipine (NORVASC) 5 MG tablet Take 5 mg by mouth daily.    Marland Kitchen aspirin EC 81 MG tablet Take 81 mg by mouth daily.    Marland Kitchen atorvastatin (LIPITOR) 40 MG tablet Take 40 mg by mouth daily.    . clonazePAM (KLONOPIN) 0.5 MG tablet Take 0.5 mg by mouth.    . Fexofenadine HCl (ALLERGY 24-HR PO) Take by mouth as needed.    . Fluticasone Propionate (FLONASE NA) Place into the nose.    . potassium chloride (MICRO-K) 10 MEQ CR capsule Take 1 capsule by mouth daily.    . propranolol (INDERAL) 40 MG tablet Take 1 tablet by mouth daily.    Marland Kitchen zolpidem (AMBIEN) 10 MG tablet Take 10 mg by mouth.     No current facility-administered medications for this visit.     Review of Systems Review of Systems  Constitutional: Negative.   Respiratory: Negative.  Cardiovascular: Negative.     Blood pressure 118/68, pulse 68, resp. rate 14, height 4' 11"  (1.499 m), weight 119 lb (54 kg).  Physical Exam Physical Exam  Constitutional: She is oriented to person, place, and time. She appears well-developed and well-nourished.  HENT:  Mouth/Throat: Oropharynx is clear and moist.  Eyes: Conjunctivae are normal. No scleral icterus.  Neck: Neck supple.  Cardiovascular: Normal rate, regular rhythm and normal heart sounds.  Pulmonary/Chest: Effort normal and breath sounds normal. Right breast exhibits no inverted nipple, no mass, no nipple discharge, no skin change and no tenderness.    Left mastectomy site well healed.   Lymphadenopathy:    She has no cervical adenopathy.    She has no axillary adenopathy.  Neurological: She is alert and oriented to person, place, and time.  Skin: Skin is warm and dry.  Psychiatric: Her  behavior is normal.    Data Reviewed Right breast screening mammogram dated March 24, 2018 reviewed.  No interval change.  BI-RADS-1.  Assessment    No evidence of cancer recurrence.    Plan  The patient has been asked to return to the office in one year with a right diagnostic mammogram. The patient is aware to call back for any questions or concerns.   HPI, Physical Exam, Assessment and Plan have been scribed under the direction and in the presence of Kendra Ard, MD.  Kendra [name], CMA  I have completed the exam and reviewed the above documentation for accuracy and completeness.  I agree with the above.  Haematologist has been used and any errors in dictation or transcription are unintentional.  Kendra [name], M.D., F.A.C.S.  Kendra [name] Kendra [name] 04/09/2018, 7:18 AM

## 2018-04-08 NOTE — Patient Instructions (Signed)
The patient has been asked to return to the office in one year with a right diagnostic mammogram. The patient is aware to call back for any questions or concerns.

## 2018-10-03 IMAGING — MG MM DIGITAL SCREENING UNILAT*R* W/ TOMO W/ CAD
6 series · 6 of 14 positions shown · non-contrast
Comparison: Previous exam(s).

CLINICAL DATA: Screening.

EXAM:
DIGITAL SCREENING UNILATERAL RIGHT MAMMOGRAM WITH CAD AND TOMO

[R CC synth-2D]
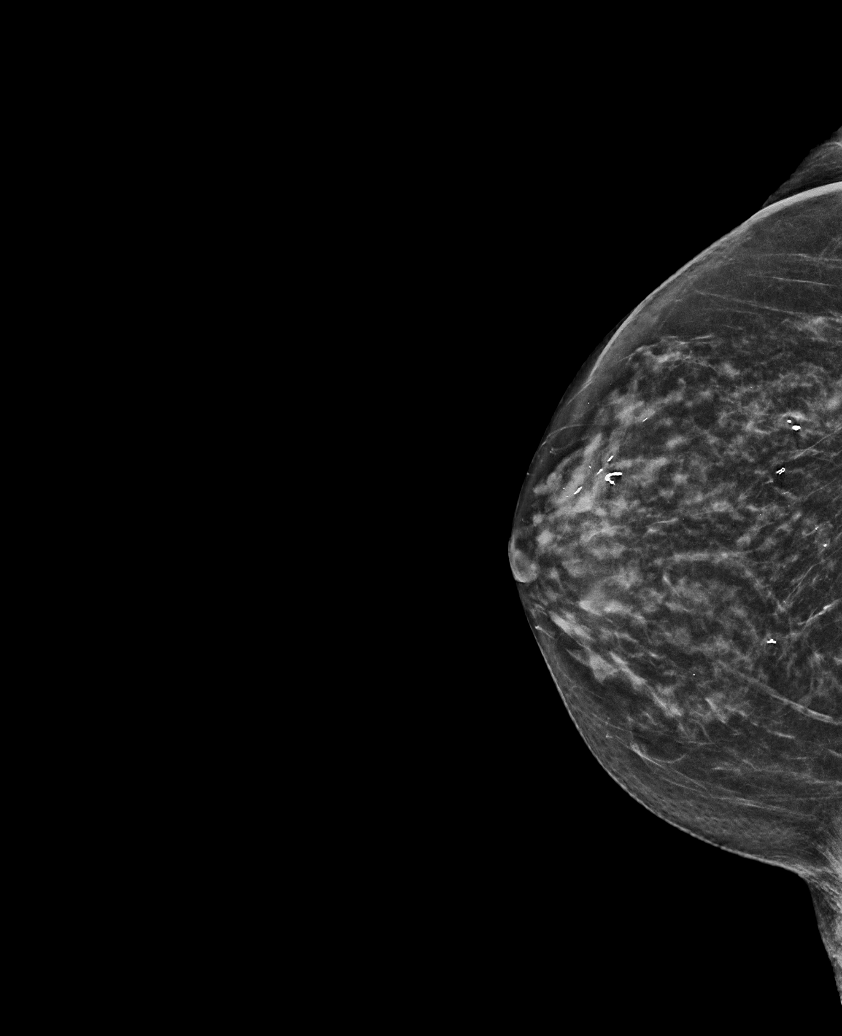

[R MLO]
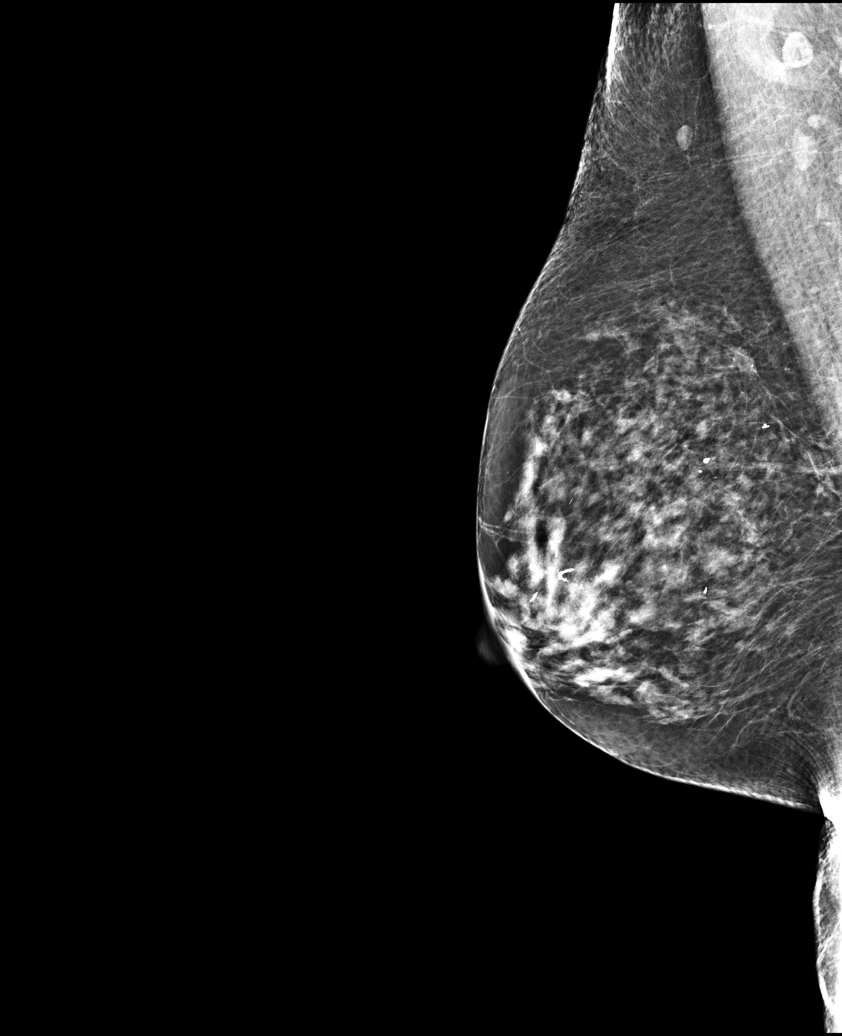

[R MLO synth-2D]
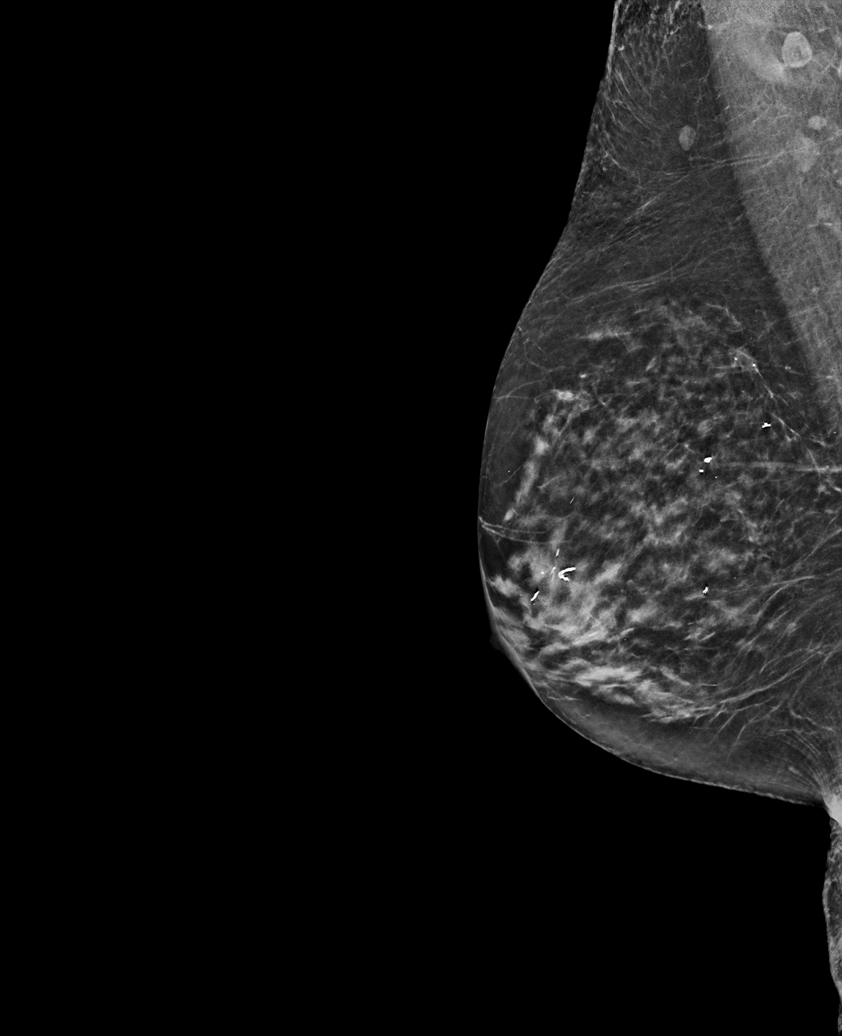

[R CC]
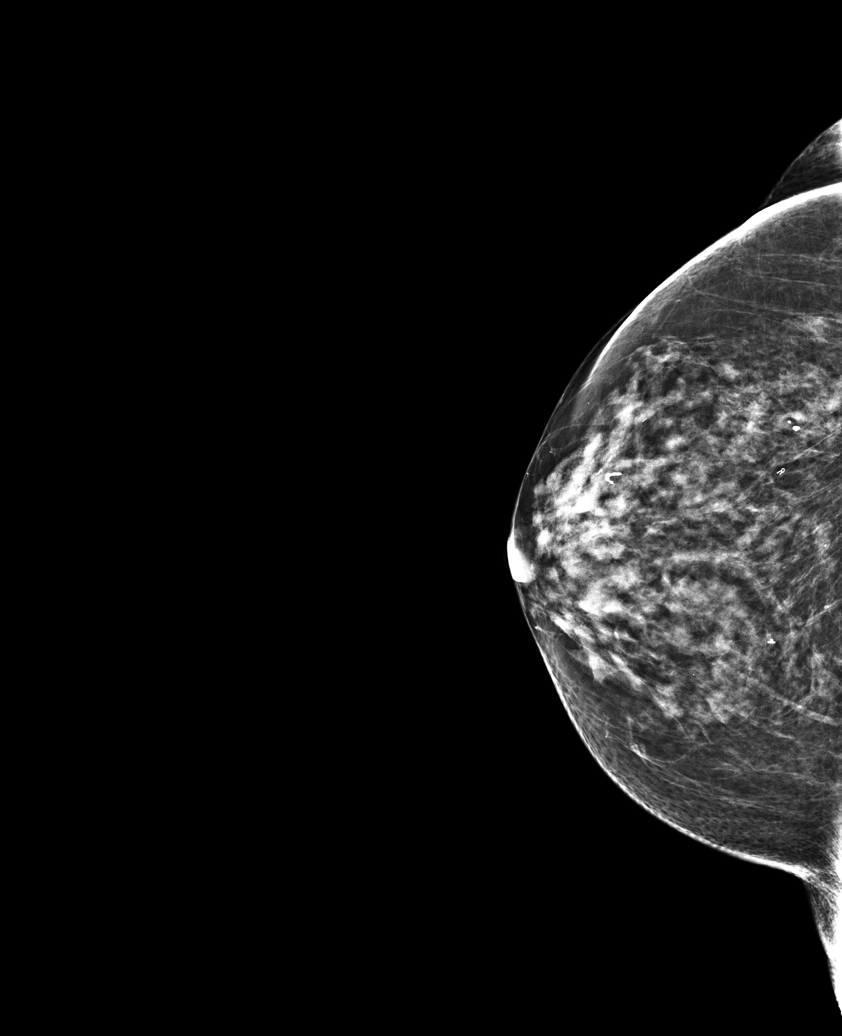

[R CC tomo · tomo slice 29/56.0]
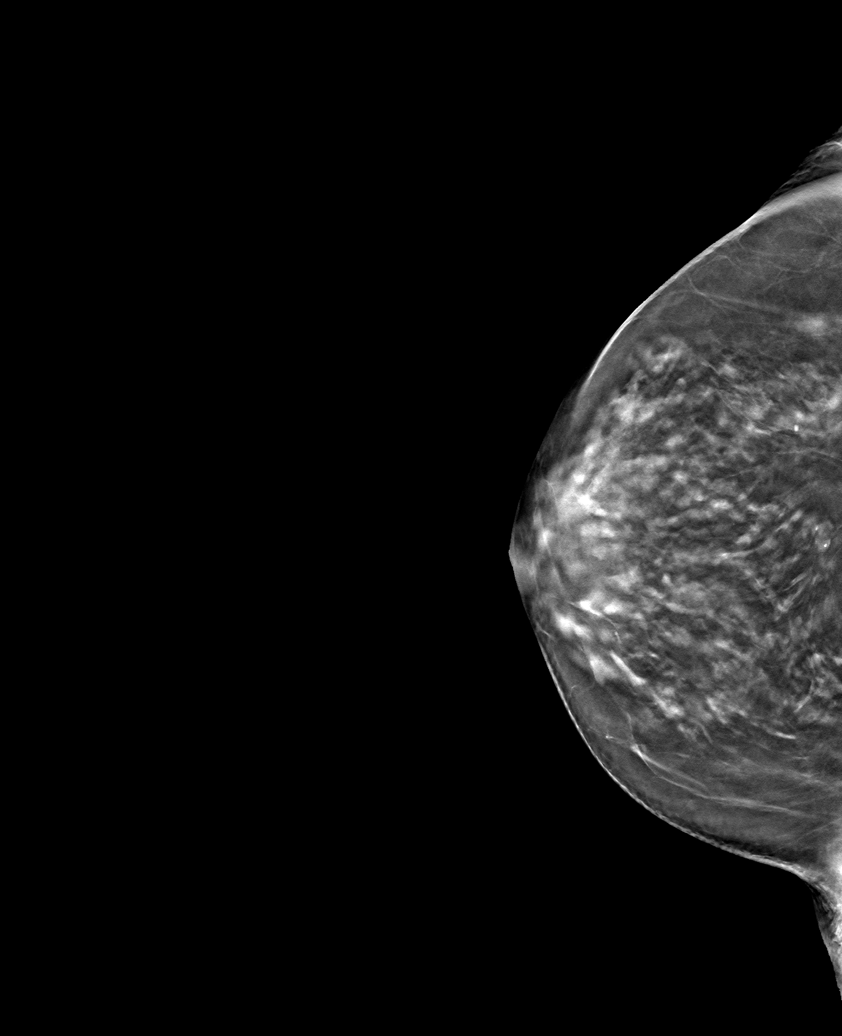

[R MLO tomo · tomo slice 30/59.0]
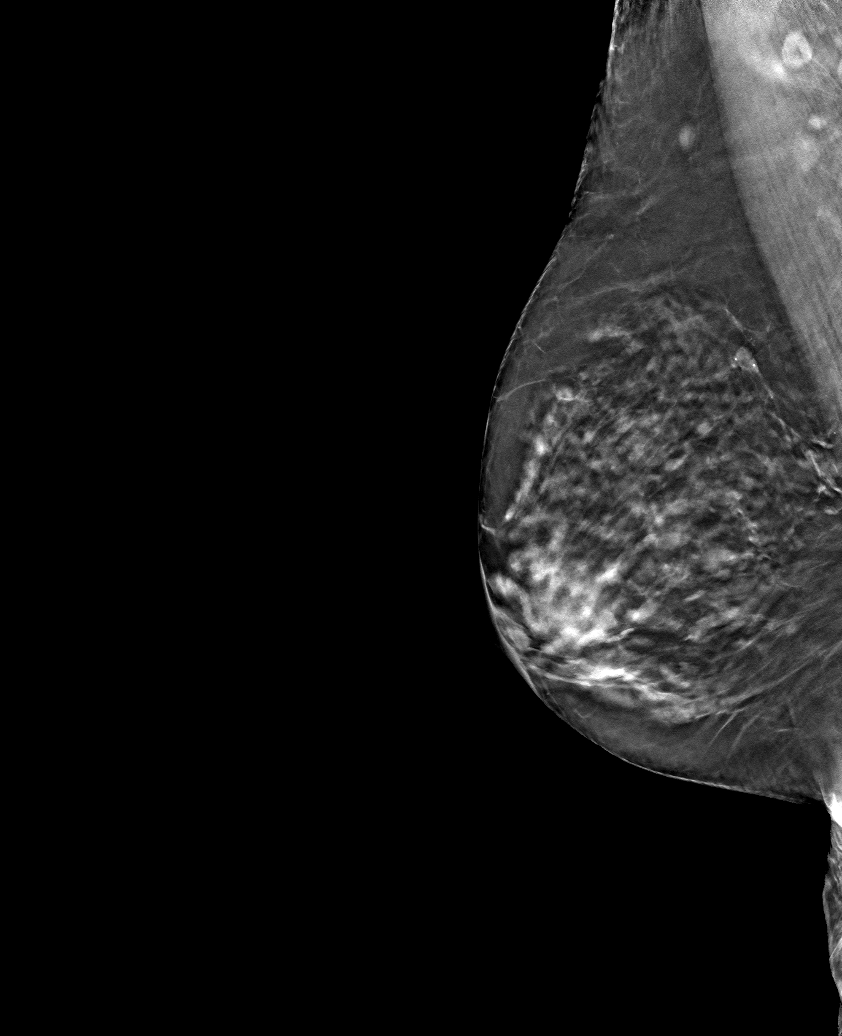

[6 of 14 positions shown; findings below may reference images not displayed]

ACR Breast Density Category c: The breast tissue is heterogeneously
dense, which may obscure small masses.
FINDINGS: The patient has had a left mastectomy. There are no findings
suspicious for malignancy. Images were processed with CAD.
IMPRESSION: No mammographic evidence of malignancy. A result letter of this
screening mammogram will be mailed directly to the patient.

RECOMMENDATION:
Screening mammogram in one year.  (Code:CY-P-D4Y)

BI-RADS CATEGORY  1: Negative.

## 2019-03-01 ENCOUNTER — Other Ambulatory Visit: Payer: Self-pay

## 2019-03-01 DIAGNOSIS — Z1231 Encounter for screening mammogram for malignant neoplasm of breast: Secondary | ICD-10-CM

## 2019-04-05 ENCOUNTER — Ambulatory Visit: Payer: Medicare Other | Admitting: General Surgery

## 2019-08-09 ENCOUNTER — Telehealth: Payer: Self-pay | Admitting: *Deleted

## 2019-08-09 NOTE — Telephone Encounter (Signed)
Called patient and left message about rescheduling her mammogram and then to come see either Dr.Pabon or Dr.Piscoya, she was a Dr.Byrnett patient.

## 2019-08-11 ENCOUNTER — Ambulatory Visit: Payer: Medicare Other | Admitting: General Surgery

## 2019-09-07 ENCOUNTER — Encounter: Payer: Self-pay | Admitting: *Deleted

## 2019-10-11 ENCOUNTER — Other Ambulatory Visit: Payer: Self-pay | Admitting: General Surgery

## 2019-10-11 DIAGNOSIS — Z1231 Encounter for screening mammogram for malignant neoplasm of breast: Secondary | ICD-10-CM

## 2020-06-28 ENCOUNTER — Other Ambulatory Visit: Payer: Self-pay | Admitting: General Surgery

## 2020-06-28 DIAGNOSIS — Z1231 Encounter for screening mammogram for malignant neoplasm of breast: Secondary | ICD-10-CM

## 2021-12-13 ENCOUNTER — Other Ambulatory Visit: Payer: Self-pay | Admitting: Internal Medicine

## 2021-12-13 DIAGNOSIS — Z853 Personal history of malignant neoplasm of breast: Secondary | ICD-10-CM

## 2021-12-13 DIAGNOSIS — Z1231 Encounter for screening mammogram for malignant neoplasm of breast: Secondary | ICD-10-CM

## 2022-10-01 ENCOUNTER — Encounter: Payer: Self-pay | Admitting: Ophthalmology

## 2022-10-06 NOTE — Discharge Instructions (Signed)

## 2022-10-08 ENCOUNTER — Other Ambulatory Visit: Payer: Self-pay

## 2022-10-08 ENCOUNTER — Ambulatory Visit: Payer: Medicare Other | Admitting: Anesthesiology

## 2022-10-08 ENCOUNTER — Ambulatory Visit
Admission: RE | Admit: 2022-10-08 | Discharge: 2022-10-08 | Disposition: A | Discharge status: Home / Self Care | Payer: Medicare Other | Attending: Ophthalmology | Admitting: Ophthalmology

## 2022-10-08 ENCOUNTER — Encounter: Payer: Self-pay | Admitting: Ophthalmology

## 2022-10-08 ENCOUNTER — Encounter
Admission: RE | Disposition: A | Discharge status: Home / Self Care | Payer: Self-pay | Source: Home / Self Care | Attending: Ophthalmology

## 2022-10-08 DIAGNOSIS — H2511 Age-related nuclear cataract, right eye: Secondary | ICD-10-CM | POA: Diagnosis not present

## 2022-10-08 DIAGNOSIS — Z8673 Personal history of transient ischemic attack (TIA), and cerebral infarction without residual deficits: Secondary | ICD-10-CM | POA: Diagnosis not present

## 2022-10-08 DIAGNOSIS — I10 Essential (primary) hypertension: Secondary | ICD-10-CM | POA: Insufficient documentation

## 2022-10-08 DIAGNOSIS — Z87891 Personal history of nicotine dependence: Secondary | ICD-10-CM | POA: Insufficient documentation

## 2022-10-08 DIAGNOSIS — G4730 Sleep apnea, unspecified: Secondary | ICD-10-CM | POA: Diagnosis not present

## 2022-10-08 DIAGNOSIS — Z853 Personal history of malignant neoplasm of breast: Secondary | ICD-10-CM | POA: Insufficient documentation

## 2022-10-08 HISTORY — PX: CATARACT EXTRACTION W/PHACO: SHX586

## 2022-10-08 HISTORY — DX: Sleep apnea, unspecified: G47.30

## 2022-10-08 SURGERY — PHACOEMULSIFICATION, CATARACT, WITH IOL INSERTION
Anesthesia: Monitor Anesthesia Care | Site: Eye | Laterality: Right

## 2022-10-08 MED ORDER — CEFUROXIME OPHTHALMIC INJECTION 1 MG/0.1 ML
INJECTION | OPHTHALMIC | Status: DC | PRN
  Administered 2022-10-08: 1 mg via OPHTHALMIC

## 2022-10-08 MED ORDER — TETRACAINE HCL 0.5 % OP SOLN
1.0000 [drp] | OPHTHALMIC | Status: DC | PRN
  Administered 2022-10-08 (×3): 1 [drp] via OPHTHALMIC

## 2022-10-08 MED ORDER — SIGHTPATH DOSE#1 BSS IO SOLN
INTRAOCULAR | Status: DC | PRN
  Administered 2022-10-08: 1 mL via INTRAMUSCULAR

## 2022-10-08 MED ORDER — SIGHTPATH DOSE#1 BSS IO SOLN
INTRAOCULAR | Status: DC | PRN
  Administered 2022-10-08: 15 mL

## 2022-10-08 MED ORDER — ARMC OPHTHALMIC DILATING DROPS
1.0000 | OPHTHALMIC | Status: DC | PRN
  Administered 2022-10-08 (×3): 1 via OPHTHALMIC

## 2022-10-08 MED ORDER — SIGHTPATH DOSE#1 NA HYALUR & NA CHOND-NA HYALUR IO KIT
PACK | INTRAOCULAR | Status: DC | PRN
  Administered 2022-10-08: 1 via OPHTHALMIC

## 2022-10-08 MED ORDER — MIDAZOLAM HCL 2 MG/2ML IJ SOLN
INTRAMUSCULAR | Status: DC | PRN
  Administered 2022-10-08: 2 mg via INTRAVENOUS

## 2022-10-08 MED ORDER — SIGHTPATH DOSE#1 BSS IO SOLN
INTRAOCULAR | Status: DC | PRN
  Administered 2022-10-08: 76 mL via OPHTHALMIC

## 2022-10-08 MED ORDER — BRIMONIDINE TARTRATE-TIMOLOL 0.2-0.5 % OP SOLN
OPHTHALMIC | Status: DC | PRN
  Administered 2022-10-08: 1 [drp] via OPHTHALMIC

## 2022-10-08 MED ORDER — FENTANYL CITRATE (PF) 100 MCG/2ML IJ SOLN
INTRAMUSCULAR | Status: DC | PRN
  Administered 2022-10-08: 50 ug via INTRAVENOUS

## 2022-10-08 SURGICAL SUPPLY — 10 items
CATARACT SUITE SIGHTPATH (MISCELLANEOUS) ×1 IMPLANT
FEE CATARACT SUITE SIGHTPATH (MISCELLANEOUS) ×1 IMPLANT
GLOVE SRG 8 PF TXTR STRL LF DI (GLOVE) ×1 IMPLANT
GLOVE SURG ENC TEXT LTX SZ7.5 (GLOVE) ×1 IMPLANT
GLOVE SURG UNDER POLY LF SZ8 (GLOVE) ×1
LENS IOL TECNIS EYHANCE 22.0 (Intraocular Lens) IMPLANT
NDL FILTER BLUNT 18X1 1/2 (NEEDLE) ×1 IMPLANT
NEEDLE FILTER BLUNT 18X1 1/2 (NEEDLE) ×1 IMPLANT
SYR 3ML LL SCALE MARK (SYRINGE) ×1 IMPLANT
WATER STERILE IRR 250ML POUR (IV SOLUTION) ×1 IMPLANT

## 2022-10-08 NOTE — Transfer of Care (Signed)
Immediate Anesthesia Transfer of Care Note  Patient: Kendra [name]  Procedure(s) Performed: CATARACT EXTRACTION PHACO AND INTRAOCULAR LENS PLACEMENT (IOC) RIGHT (Right: Eye)  Patient Location: PACU  Anesthesia Type: MAC  Level of Consciousness: awake, alert  and patient cooperative  Airway and Oxygen Therapy: Patient Spontanous Breathing and Patient connected to supplemental oxygen  Post-op Assessment: Post-op Vital signs reviewed, Patient's Cardiovascular Status Stable, Respiratory Function Stable, Patent Airway and No signs of Nausea or vomiting  Post-op Vital Signs: Reviewed and stable  Complications: There were no known notable events for this encounter.

## 2022-10-08 NOTE — Anesthesia Postprocedure Evaluation (Signed)
Anesthesia Post Note  Patient: Kendra [name]  Procedure(s) Performed: CATARACT EXTRACTION PHACO AND INTRAOCULAR LENS PLACEMENT (IOC) RIGHT (Right: Eye)  Patient location during evaluation: PACU Anesthesia Type: MAC Level of consciousness: awake and alert Pain management: pain level controlled Vital Signs Assessment: post-procedure vital signs reviewed and stable Respiratory status: spontaneous breathing, nonlabored ventilation and respiratory function stable Cardiovascular status: blood pressure returned to baseline and stable Postop Assessment: no apparent nausea or vomiting Anesthetic complications: no   There were no known notable events for this encounter.   Last Vitals:  Vitals:   10/08/22 1356 10/08/22 1400  BP: 132/80 124/69  Pulse: (!) 51 (!) 51  Resp: 14 (!) 9  Temp: (!) 36.4 C   SpO2: 98% 97%    Last Pain:  Vitals:   10/08/22 1356  PainSc: 0-No pain                 [place] Ouch

## 2022-10-08 NOTE — Anesthesia Preprocedure Evaluation (Addendum)
Anesthesia Evaluation  Patient identified by MRN, date of birth, ID band Patient awake    Reviewed: Allergy & Precautions, NPO status , Patient's Chart, lab work & pertinent test results  Airway Mallampati: II  TM Distance: >3 FB Neck ROM: full    Dental no notable dental hx.    Pulmonary sleep apnea , former smoker,    Pulmonary exam normal        Cardiovascular hypertension, Normal cardiovascular exam     Neuro/Psych TIAnegative psych ROS   GI/Hepatic negative GI ROS, Neg liver ROS,   Endo/Other  negative endocrine ROS  Renal/GU      Musculoskeletal  (+) Arthritis ,   Abdominal   Peds  Hematology negative hematology ROS (+)   Anesthesia Other Findings Past Medical History: No date: Arthritis 1986: Breast cancer (HCC)     Comment:  T1c,N0 invasive ductal carcinoma LEFT 03/2014: Genetic testing     Comment:  No BRCA mutation. 2008: Hypertension No date: Sleep apnea     Comment:  CPAP 2008: TIA (transient ischemic attack)  Past Surgical History: 1968: APPENDECTOMY 1986: AUGMENTATION MAMMAPLASTY; Left     Comment:  LEFT ONLY - MASTECTOMY RECONSTRUCTION 2010: basal cell carcimona removal      Comment:  left lower leg  2006 & 2012: BREAST BIOPSY; Right     Comment:  Benign 2003: BREAST EXCISIONAL BIOPSY; Right     Comment:  NEG 1986: BREAST SURGERY     Comment:  Left modified radical mastectomy. April 1989: BREAST SURGERY; Right     Comment:  biopsy: Fibrocystic changes. June 16, 2003: BREAST SURGERY; Right     Comment:  stereotactic biopsy showing stromal fibrosis and               microcalcifications. May 17, 2011: BREAST SURGERY; Right     Comment:  hyalinized fibroadenoma February 07, 2005: BREAST SURGERY; Right     Comment:  Sclerosed fibroadenoma 2005,04/12/2014: COLONOSCOPY     Comment:  Normal exam in 2015, 10 year follow-up recommended. Dr.               Byrnett 2010: DEBRIDEMENT AND  CLOSURE WOUND No date: DIAGNOSTIC LAPAROSCOPY 1993: Disc repair  1986: MASTECTOMY; Left     Comment:  BREAST CANCER 1962: TONSILLECTOMY 2010: uterine biopsy   BMI    Body Mass Index: 24.67 kg/m      Reproductive/Obstetrics negative OB ROS                             Anesthesia Physical  Anesthesia Plan  ASA: 3  Anesthesia Plan: MAC   Post-op Pain Management:    Induction:   PONV Risk Score and Plan:   Airway Management Planned: Natural Airway  Additional Equipment:   Intra-op Plan:   Post-operative Plan:   Informed Consent: I have reviewed the patients History and Physical, chart, labs and discussed the procedure including the risks, benefits and alternatives for the proposed anesthesia with the patient or authorized representative who has indicated his/her understanding and acceptance.       Plan Discussed with: Anesthesiologist, CRNA and Surgeon  Anesthesia Plan Comments:         Anesthesia Quick Evaluation  

## 2022-10-08 NOTE — H&P (Signed)
[facility]   Primary Care Physician:  Kirk Ruths, MD Ophthalmologist: Dr. Leandrew Koyanagi  Pre-Procedure History & Physical: HPI:  Kendra [name] is a 76 y.o. female here for ophthalmic surgery.   Past Medical History:  Diagnosis Date   Arthritis    Breast cancer ([place]) 1986   T1c,N0 invasive ductal carcinoma LEFT   Genetic testing 03/2014   No BRCA mutation.   Hypertension 2008   Sleep apnea    CPAP   TIA (transient ischemic attack) 2008    Past Surgical History:  Procedure Laterality Date   APPENDECTOMY  1968   AUGMENTATION MAMMAPLASTY Left 1986   LEFT ONLY - MASTECTOMY RECONSTRUCTION   basal cell carcimona removal   2010   left lower leg    BREAST BIOPSY Right 2006 & 2012   Benign   BREAST EXCISIONAL BIOPSY Right 2003   NEG   BREAST SURGERY  1986   Left modified radical mastectomy.   BREAST SURGERY Right April 1989   biopsy: Fibrocystic changes.   BREAST SURGERY Right June 16, 2003   stereotactic biopsy showing stromal fibrosis and microcalcifications.   BREAST SURGERY Right May 17, 2011   hyalinized fibroadenoma   BREAST SURGERY Right February 07, 2005   Sclerosed fibroadenoma   COLONOSCOPY  2005,04/12/2014   Normal exam in 2015, 10 year follow-up recommended. Dr. Bary Castilla   DEBRIDEMENT AND CLOSURE WOUND  2010   DIAGNOSTIC LAPAROSCOPY     Disc repair   1993   MASTECTOMY Left 1986   BREAST CANCER   TONSILLECTOMY  1962   uterine biopsy   2010    Prior to Admission medications   Medication Sig Start Date End Date Taking? Authorizing Provider  amLODipine (NORVASC) 5 MG tablet Take 5 mg by mouth daily.   Yes [provider]  aspirin EC 81 MG tablet Take 81 mg by mouth daily.   Yes [provider]  atorvastatin (LIPITOR) 40 MG tablet Take 10 mg by mouth daily.   Yes [provider]  clonazePAM (KLONOPIN) 0.5 MG tablet Take 0.5 mg by mouth.   Yes [provider]  potassium chloride (MICRO-K) 10 MEQ  CR capsule Take 1 capsule by mouth daily. 03/21/14  Yes [provider]  propranolol (INDERAL) 40 MG tablet Take 1.5 tablets by mouth 2 (two) times daily. 03/21/14  Yes [provider]    Allergies as of 08/27/2022 - Review Complete 03/31/2017  Allergen Reaction Noted   Seasonal ic [cholestatin]  02/04/2013    Family History  Problem Relation Age of Onset   Breast cancer Cousin    Ovarian cancer Mother 42       2010    Social History   Socioeconomic History   Marital status: Married    Spouse name: Not on file   Number of children: Not on file   Years of education: Not on file   Highest education level: Not on file  Occupational History   Not on file  Tobacco Use   Smoking status: Former    Packs/day: 0.50    Years: 2.00    Total pack years: 1.00    Types: Cigarettes    Quit date: 51    Years since quitting: 38.8   Smokeless tobacco: Never  Vaping Use   Vaping Use: Never used  Substance and Sexual Activity   Alcohol use: No   Drug use: No   Sexual activity: Not on file  Other Topics Concern  Not on file  Social History Narrative   Not on file   Social Determinants of Health   Financial Resource Strain: Not on file  Food Insecurity: Not on file  Transportation Needs: Not on file  Physical Activity: Not on file  Stress: Not on file  Social Connections: Not on file  Intimate Partner Violence: Not on file    Review of Systems: See HPI, otherwise negative ROS  Physical Exam: BP 136/86   Pulse (!) 59   Temp (!) 97 F (36.1 C)   Ht _0  (1.448 m)   Wt 49 kg   SpO2 96%   BMI 23.37 kg/m  General:   Alert,  pleasant and cooperative in NAD Head:  Normocephalic and atraumatic. Lungs:  Clear to auscultation.    Heart:  Regular rate and rhythm.   Impression/Plan: [name] is here for ophthalmic surgery.  Risks, benefits, limitations, and alternatives regarding ophthalmic surgery have been reviewed with the patient.   Questions have been answered.  All parties agreeable.   Leandrew Koyanagi, MD  10/08/2022, 12:51 PM

## 2022-10-08 NOTE — Op Note (Signed)
LOCATION:  [place]   PREOPERATIVE DIAGNOSIS:    Nuclear sclerotic cataract right eye. H25.11   POSTOPERATIVE DIAGNOSIS:  Nuclear sclerotic cataract right eye.     PROCEDURE:  Phacoemusification with posterior chamber intraocular lens placement of the right eye   ULTRASOUND TIME: Procedure(s) with comments: CATARACT EXTRACTION PHACO AND INTRAOCULAR LENS PLACEMENT (IOC) RIGHT (Right) - Sleep apnea 5.56 00:59.9  LENS:   Implant Name Type Inv. Item Serial No. Manufacturer Lot No. LRB No. Used Action  LENS IOL TECNIS EYHANCE 22.0 - [id number] Intraocular Lens LENS IOL TECNIS EYHANCE 22.0 [id number] [employer]  Right 1 Implanted         SURGEON:  Wyonia Hough, MD   ANESTHESIA:  Topical with tetracaine drops and 2% Xylocaine jelly, augmented with 1% preservative-free intracameral lidocaine.    COMPLICATIONS:  None.   DESCRIPTION OF PROCEDURE:  The patient was identified in the holding room and transported to the operating room and placed in the supine position under the operating microscope.  The right eye was identified as the operative eye and it was prepped and draped in the usual sterile ophthalmic fashion.   A 1 millimeter clear-corneal paracentesis was made at the 12:00 position.  0.5 ml of preservative-free 1% lidocaine was injected into the anterior chamber. The anterior chamber was filled with Viscoat viscoelastic.  A 2.4 millimeter keratome was used to make a near-clear corneal incision at the 9:00 position.  A curvilinear capsulorrhexis was made with a cystotome and capsulorrhexis forceps.  Balanced salt solution was used to hydrodissect and hydrodelineate the nucleus.   Phacoemulsification was then used in stop and chop fashion to remove the lens nucleus and epinucleus.  The remaining cortex was then removed using the irrigation and aspiration handpiece. Provisc was then placed into the capsular bag to distend it for lens placement.  A lens was then  injected into the capsular bag.  The remaining viscoelastic was aspirated.   Wounds were hydrated with balanced salt solution.  The anterior chamber was inflated to a physiologic pressure with balanced salt solution.  No wound leaks were noted. Cefuroxime 0.1 ml of a '10mg'$ /ml solution was injected into the anterior chamber for a dose of 1 mg of intracameral antibiotic at the completion of the case.   Timolol and Brimonidine drops were applied to the eye.  The patient was taken to the recovery room in stable condition without complications of anesthesia or surgery.   Kendra [name] 10/08/2022, 1:54 PM

## 2022-10-09 ENCOUNTER — Encounter: Payer: Self-pay | Admitting: Ophthalmology

## 2022-10-21 NOTE — Discharge Instructions (Signed)

## 2022-10-22 ENCOUNTER — Ambulatory Visit
Admission: RE | Admit: 2022-10-22 | Discharge: 2022-10-22 | Disposition: A | Discharge status: Home / Self Care | Payer: Medicare Other | Attending: Ophthalmology | Admitting: Ophthalmology

## 2022-10-22 ENCOUNTER — Encounter
Admission: RE | Disposition: A | Discharge status: Home / Self Care | Payer: Self-pay | Source: Home / Self Care | Attending: Ophthalmology

## 2022-10-22 ENCOUNTER — Other Ambulatory Visit: Payer: Self-pay

## 2022-10-22 ENCOUNTER — Ambulatory Visit: Payer: Medicare Other | Admitting: Anesthesiology

## 2022-10-22 ENCOUNTER — Encounter: Payer: Self-pay | Admitting: Ophthalmology

## 2022-10-22 DIAGNOSIS — Z87891 Personal history of nicotine dependence: Secondary | ICD-10-CM | POA: Diagnosis not present

## 2022-10-22 DIAGNOSIS — I10 Essential (primary) hypertension: Secondary | ICD-10-CM | POA: Insufficient documentation

## 2022-10-22 DIAGNOSIS — H2512 Age-related nuclear cataract, left eye: Secondary | ICD-10-CM | POA: Diagnosis present

## 2022-10-22 DIAGNOSIS — Z853 Personal history of malignant neoplasm of breast: Secondary | ICD-10-CM | POA: Diagnosis not present

## 2022-10-22 DIAGNOSIS — Z8673 Personal history of transient ischemic attack (TIA), and cerebral infarction without residual deficits: Secondary | ICD-10-CM | POA: Diagnosis not present

## 2022-10-22 DIAGNOSIS — G4730 Sleep apnea, unspecified: Secondary | ICD-10-CM | POA: Diagnosis not present

## 2022-10-22 HISTORY — PX: CATARACT EXTRACTION W/PHACO: SHX586

## 2022-10-22 SURGERY — PHACOEMULSIFICATION, CATARACT, WITH IOL INSERTION
Anesthesia: Monitor Anesthesia Care | Site: Eye | Laterality: Left

## 2022-10-22 MED ORDER — ARMC OPHTHALMIC DILATING DROPS
1.0000 | OPHTHALMIC | Status: DC | PRN
  Administered 2022-10-22 (×3): 1 via OPHTHALMIC

## 2022-10-22 MED ORDER — SIGHTPATH DOSE#1 BSS IO SOLN
INTRAOCULAR | Status: DC | PRN
  Administered 2022-10-22: 1 mL via INTRAMUSCULAR

## 2022-10-22 MED ORDER — SIGHTPATH DOSE#1 BSS IO SOLN
INTRAOCULAR | Status: DC | PRN
  Administered 2022-10-22: 15 mL

## 2022-10-22 MED ORDER — LACTATED RINGERS IV SOLN: INTRAVENOUS | Status: DC

## 2022-10-22 MED ORDER — BRIMONIDINE TARTRATE-TIMOLOL 0.2-0.5 % OP SOLN
OPHTHALMIC | Status: DC | PRN
  Administered 2022-10-22: 1 [drp] via OPHTHALMIC

## 2022-10-22 MED ORDER — CEFUROXIME OPHTHALMIC INJECTION 1 MG/0.1 ML
INJECTION | OPHTHALMIC | Status: DC | PRN
  Administered 2022-10-22: 1 mg via OPHTHALMIC

## 2022-10-22 MED ORDER — FENTANYL CITRATE (PF) 100 MCG/2ML IJ SOLN
INTRAMUSCULAR | Status: DC | PRN
  Administered 2022-10-22 (×2): 50 ug via INTRAVENOUS

## 2022-10-22 MED ORDER — SIGHTPATH DOSE#1 BSS IO SOLN
INTRAOCULAR | Status: DC | PRN
  Administered 2022-10-22: 67 mL via OPHTHALMIC

## 2022-10-22 MED ORDER — SIGHTPATH DOSE#1 NA HYALUR & NA CHOND-NA HYALUR IO KIT
PACK | INTRAOCULAR | Status: DC | PRN
  Administered 2022-10-22: 1 via OPHTHALMIC

## 2022-10-22 MED ORDER — TETRACAINE HCL 0.5 % OP SOLN
1.0000 [drp] | OPHTHALMIC | Status: DC | PRN
  Administered 2022-10-22 (×3): 1 [drp] via OPHTHALMIC

## 2022-10-22 MED ORDER — MIDAZOLAM HCL 2 MG/2ML IJ SOLN
INTRAMUSCULAR | Status: DC | PRN
  Administered 2022-10-22 (×2): 1 mg via INTRAVENOUS

## 2022-10-22 SURGICAL SUPPLY — 10 items
CATARACT SUITE SIGHTPATH (MISCELLANEOUS) ×1 IMPLANT
FEE CATARACT SUITE SIGHTPATH (MISCELLANEOUS) ×1 IMPLANT
GLOVE SRG 8 PF TXTR STRL LF DI (GLOVE) ×1 IMPLANT
GLOVE SURG ENC TEXT LTX SZ7.5 (GLOVE) ×1 IMPLANT
GLOVE SURG UNDER POLY LF SZ8 (GLOVE) ×1
LENS IOL TECNIS EYHANCE 22.0 (Intraocular Lens) IMPLANT
NDL FILTER BLUNT 18X1 1/2 (NEEDLE) ×1 IMPLANT
NEEDLE FILTER BLUNT 18X1 1/2 (NEEDLE) ×1 IMPLANT
SYR 3ML LL SCALE MARK (SYRINGE) ×1 IMPLANT
WATER STERILE IRR 250ML POUR (IV SOLUTION) ×1 IMPLANT

## 2022-10-22 NOTE — Transfer of Care (Signed)
Immediate Anesthesia Transfer of Care Note  Patient: Kendra [name]  Procedure(s) Performed: CATARACT EXTRACTION PHACO AND INTRAOCULAR LENS PLACEMENT (IOC) LEFT (Left: Eye)  Patient Location: PACU  Anesthesia Type: MAC  Level of Consciousness: awake, alert  and patient cooperative  Airway and Oxygen Therapy: Patient Spontanous Breathing and Patient connected to supplemental oxygen  Post-op Assessment: Post-op Vital signs reviewed, Patient's Cardiovascular Status Stable, Respiratory Function Stable, Patent Airway and No signs of Nausea or vomiting  Post-op Vital Signs: Reviewed and stable  Complications: No notable events documented.

## 2022-10-22 NOTE — Anesthesia Preprocedure Evaluation (Signed)
Anesthesia Evaluation  Patient identified by MRN, date of birth, ID band Patient awake    Reviewed: Allergy & Precautions, NPO status , Patient's Chart, lab work & pertinent test results  Airway Mallampati: II  TM Distance: >3 FB Neck ROM: full    Dental no notable dental hx.    Pulmonary sleep apnea , former smoker,    Pulmonary exam normal        Cardiovascular hypertension, Normal cardiovascular exam     Neuro/Psych TIAnegative psych ROS   GI/Hepatic negative GI ROS, Neg liver ROS,   Endo/Other  negative endocrine ROS  Renal/GU      Musculoskeletal  (+) Arthritis ,   Abdominal   Peds  Hematology negative hematology ROS (+)   Anesthesia Other Findings Past Medical History: No date: Arthritis 1986: Breast cancer (HCC)     Comment:  T1c,N0 invasive ductal carcinoma LEFT 03/2014: Genetic testing     Comment:  No BRCA mutation. 2008: Hypertension No date: Sleep apnea     Comment:  CPAP 2008: TIA (transient ischemic attack)  Past Surgical History: 1968: APPENDECTOMY 1986: AUGMENTATION MAMMAPLASTY; Left     Comment:  LEFT ONLY - MASTECTOMY RECONSTRUCTION 2010: basal cell carcimona removal      Comment:  left lower leg  2006 & 2012: BREAST BIOPSY; Right     Comment:  Benign 2003: BREAST EXCISIONAL BIOPSY; Right     Comment:  NEG 1986: BREAST SURGERY     Comment:  Left modified radical mastectomy. April 1989: BREAST SURGERY; Right     Comment:  biopsy: Fibrocystic changes. June 16, 2003: BREAST SURGERY; Right     Comment:  stereotactic biopsy showing stromal fibrosis and               microcalcifications. May 17, 2011: BREAST SURGERY; Right     Comment:  hyalinized fibroadenoma February 07, 2005: BREAST SURGERY; Right     Comment:  Sclerosed fibroadenoma 2005,04/12/2014: COLONOSCOPY     Comment:  Normal exam in 2015, 10 year follow-up recommended. Dr.               Byrnett 2010: DEBRIDEMENT AND  CLOSURE WOUND No date: DIAGNOSTIC LAPAROSCOPY 1993: Disc repair  1986: MASTECTOMY; Left     Comment:  BREAST CANCER 1962: TONSILLECTOMY 2010: uterine biopsy   BMI    Body Mass Index: 24.67 kg/m      Reproductive/Obstetrics negative OB ROS                             Anesthesia Physical  Anesthesia Plan  ASA: 3  Anesthesia Plan: MAC   Post-op Pain Management:    Induction:   PONV Risk Score and Plan:   Airway Management Planned: Natural Airway  Additional Equipment:   Intra-op Plan:   Post-operative Plan:   Informed Consent: I have reviewed the patients History and Physical, chart, labs and discussed the procedure including the risks, benefits and alternatives for the proposed anesthesia with the patient or authorized representative who has indicated his/her understanding and acceptance.       Plan Discussed with: Anesthesiologist, CRNA and Surgeon  Anesthesia Plan Comments:         Anesthesia Quick Evaluation  

## 2022-10-22 NOTE — Op Note (Signed)
  OPERATIVE NOTE  Kendra [name] [id number] 10/22/2022   PREOPERATIVE DIAGNOSIS:  Nuclear sclerotic cataract left eye. H25.12   POSTOPERATIVE DIAGNOSIS:    Nuclear sclerotic cataract left eye.     PROCEDURE:  Phacoemusification with posterior chamber intraocular lens placement of the left eye  Ultrasound time: Procedure(s) with comments: CATARACT EXTRACTION PHACO AND INTRAOCULAR LENS PLACEMENT (IOC) LEFT (Left) - sleep apnea 10.00 01:14.9  LENS:   Implant Name Type Inv. Item Serial No. Manufacturer Lot No. LRB No. Used Action  LENS IOL TECNIS EYHANCE 22.0 - [id number] Intraocular Lens LENS IOL TECNIS EYHANCE 22.0 [id number] [employer]  Left 1 Implanted      SURGEON:  Wyonia Hough, MD   ANESTHESIA:  Topical with tetracaine drops and 2% Xylocaine jelly, augmented with 1% preservative-free intracameral lidocaine.    COMPLICATIONS:  None.   DESCRIPTION OF PROCEDURE:  The patient was identified in the holding room and transported to the operating room and placed in the supine position under the operating microscope.  The left eye was identified as the operative eye and it was prepped and draped in the usual sterile ophthalmic fashion.   A 1 millimeter clear-corneal paracentesis was made at the 1:30 position.  0.5 ml of preservative-free 1% lidocaine was injected into the anterior chamber.  The anterior chamber was filled with Viscoat viscoelastic.  A 2.4 millimeter keratome was used to make a near-clear corneal incision at the 10:30 position.  .  A curvilinear capsulorrhexis was made with a cystotome and capsulorrhexis forceps.  Balanced salt solution was used to hydrodissect and hydrodelineate the nucleus.   Phacoemulsification was then used in stop and chop fashion to remove the lens nucleus and epinucleus.  The remaining cortex was then removed using the irrigation and aspiration handpiece. Provisc was then placed into the capsular bag to distend it for lens placement.  A  lens was then injected into the capsular bag.  The remaining viscoelastic was aspirated.   Wounds were hydrated with balanced salt solution.  The anterior chamber was inflated to a physiologic pressure with balanced salt solution.  No wound leaks were noted. Cefuroxime 0.1 ml of a '10mg'$ /ml solution was injected into the anterior chamber for a dose of 1 mg of intracameral antibiotic at the completion of the case.   Timolol and Brimonidine drops were applied to the eye.  The patient was taken to the recovery room in stable condition without complications of anesthesia or surgery.  Kendra [name] 10/22/2022, 12:15 PM

## 2022-10-22 NOTE — H&P (Signed)
[facility]   Primary Care Physician:  Kirk Ruths, MD Ophthalmologist: Dr. Leandrew Koyanagi  Pre-Procedure History & Physical: HPI:  Kendra [name] is a 76 y.o. female here for ophthalmic surgery.   Past Medical History:  Diagnosis Date   Arthritis    Breast cancer ([place]) 1986   T1c,N0 invasive ductal carcinoma LEFT   Genetic testing 03/2014   No BRCA mutation.   Hypertension 2008   Sleep apnea    CPAP   TIA (transient ischemic attack) 2008    Past Surgical History:  Procedure Laterality Date   APPENDECTOMY  1968   AUGMENTATION MAMMAPLASTY Left 1986   LEFT ONLY - MASTECTOMY RECONSTRUCTION   basal cell carcimona removal   2010   left lower leg    BREAST BIOPSY Right 2006 & 2012   Benign   BREAST EXCISIONAL BIOPSY Right 2003   NEG   BREAST SURGERY  1986   Left modified radical mastectomy.   BREAST SURGERY Right April 1989   biopsy: Fibrocystic changes.   BREAST SURGERY Right June 16, 2003   stereotactic biopsy showing stromal fibrosis and microcalcifications.   BREAST SURGERY Right May 17, 2011   hyalinized fibroadenoma   BREAST SURGERY Right February 07, 2005   Sclerosed fibroadenoma   CATARACT EXTRACTION W/PHACO Right 10/08/2022   Procedure: CATARACT EXTRACTION PHACO AND INTRAOCULAR LENS PLACEMENT ([place]) RIGHT;  Surgeon: Leandrew Koyanagi, MD;  Location: [place];  Service: Ophthalmology;  Laterality: Right;  Sleep apnea 5.56 00:59.9   COLONOSCOPY  2005,04/12/2014   Normal exam in 2015, 10 year follow-up recommended. Dr. Bary Castilla   DEBRIDEMENT AND CLOSURE WOUND  2010   DIAGNOSTIC LAPAROSCOPY     Disc repair   1993   MASTECTOMY Left 1986   BREAST CANCER   TONSILLECTOMY  1962   uterine biopsy   2010    Prior to Admission medications   Medication Sig Start Date End Date Taking? Authorizing Provider  amLODipine (NORVASC) 5 MG tablet Take 5 mg by mouth daily.   Yes [provider]  aspirin EC 81 MG tablet Take 81 mg  by mouth daily.   Yes [provider]  atorvastatin (LIPITOR) 40 MG tablet Take 10 mg by mouth daily.   Yes [provider]  clonazePAM (KLONOPIN) 0.5 MG tablet Take 0.5 mg by mouth.   Yes [provider]  potassium chloride (MICRO-K) 10 MEQ CR capsule Take 1 capsule by mouth daily. 03/21/14  Yes [provider]  propranolol (INDERAL) 40 MG tablet Take 1.5 tablets by mouth 2 (two) times daily. 03/21/14  Yes [provider]    Allergies as of 08/27/2022 - Review Complete 03/31/2017  Allergen Reaction Noted   Seasonal ic [cholestatin]  02/04/2013    Family History  Problem Relation Age of Onset   Breast cancer Cousin    Ovarian cancer Mother 72       2010    Social History   Socioeconomic History   Marital status: Married    Spouse name: Not on file   Number of children: Not on file   Years of education: Not on file   Highest education level: Not on file  Occupational History   Not on file  Tobacco Use   Smoking status: Former    Packs/day: 0.50    Years: 2.00    Total pack years: 1.00    Types: Cigarettes    Quit date: 1985    Years since quitting: 63.8  Smokeless tobacco: Never  Vaping Use   Vaping Use: Never used  Substance and Sexual Activity   Alcohol use: No   Drug use: No   Sexual activity: Not on file  Other Topics Concern   Not on file  Social History Narrative   Not on file   Social Determinants of Health   Financial Resource Strain: Not on file  Food Insecurity: Not on file  Transportation Needs: Not on file  Physical Activity: Not on file  Stress: Not on file  Social Connections: Not on file  Intimate Partner Violence: Not on file    Review of Systems: See HPI, otherwise negative ROS  Physical Exam: BP 131/83   Pulse (!) 56   Temp (!) 97.1 F (36.2 C) (Temporal)   Resp 17   Ht _0  (1.448 m)   Wt 49.9 kg   SpO2 98%   BMI 23.80 kg/m  General:   Alert,  pleasant and cooperative in NAD Head:   Normocephalic and atraumatic. Lungs:  Clear to auscultation.    Heart:  Regular rate and rhythm.   Impression/Plan: [name] is here for ophthalmic surgery.  Risks, benefits, limitations, and alternatives regarding ophthalmic surgery have been reviewed with the patient.  Questions have been answered.  All parties agreeable.   Leandrew Koyanagi, MD  10/22/2022, 10:59 AM

## 2022-10-22 NOTE — Anesthesia Postprocedure Evaluation (Signed)
Anesthesia Post Note  Patient: Kendra [name]  Procedure(s) Performed: CATARACT EXTRACTION PHACO AND INTRAOCULAR LENS PLACEMENT (IOC) LEFT (Left: Eye)  Patient location during evaluation: PACU Anesthesia Type: MAC Level of consciousness: awake and alert Pain management: pain level controlled Vital Signs Assessment: post-procedure vital signs reviewed and stable Respiratory status: spontaneous breathing, nonlabored ventilation, respiratory function stable and patient connected to nasal cannula oxygen Cardiovascular status: stable and blood pressure returned to baseline Postop Assessment: no apparent nausea or vomiting Anesthetic complications: no   No notable events documented.   Last Vitals:  Vitals:   10/22/22 1049 10/22/22 1215  BP: 131/83 115/72  Pulse: (!) 56 60  Resp: 17   Temp: (!) 36.2 C (!) 36.2 C  SpO2: 98% 98%    Last Pain:  Vitals:   10/22/22 1215  TempSrc:   PainSc: 0-No pain                 Martha Clan

## 2022-10-23 ENCOUNTER — Encounter: Payer: Self-pay | Admitting: Ophthalmology
